# Patient Record
Sex: Male | Born: 1955 | Race: Black or African American | Hispanic: No | Marital: Single | Smoking: Never smoker
Health system: Southern US, Community
[De-identification: ages and names within clinical notes are randomized; demographics above are authoritative.]

## PROBLEM LIST (undated history)

## (undated) DIAGNOSIS — I10 Essential (primary) hypertension: Secondary | ICD-10-CM

---

## 2018-04-22 ENCOUNTER — Encounter (HOSPITAL_BASED_OUTPATIENT_CLINIC_OR_DEPARTMENT_OTHER): Payer: Self-pay | Admitting: Emergency Medicine

## 2018-04-22 ENCOUNTER — Inpatient Hospital Stay (HOSPITAL_BASED_OUTPATIENT_CLINIC_OR_DEPARTMENT_OTHER)
Admission: EM | Admit: 2018-04-22 | Discharge: 2018-04-24 | DRG: 066 | Disposition: A | Discharge status: Home / Self Care | Payer: Self-pay | Attending: Internal Medicine | Admitting: Internal Medicine

## 2018-04-22 ENCOUNTER — Emergency Department (HOSPITAL_BASED_OUTPATIENT_CLINIC_OR_DEPARTMENT_OTHER): Payer: Self-pay

## 2018-04-22 ENCOUNTER — Other Ambulatory Visit: Payer: Self-pay

## 2018-04-22 DIAGNOSIS — I6381 Other cerebral infarction due to occlusion or stenosis of small artery: Principal | ICD-10-CM | POA: Diagnosis present

## 2018-04-22 DIAGNOSIS — R030 Elevated blood-pressure reading, without diagnosis of hypertension: Secondary | ICD-10-CM

## 2018-04-22 DIAGNOSIS — I10 Essential (primary) hypertension: Secondary | ICD-10-CM | POA: Diagnosis present

## 2018-04-22 DIAGNOSIS — I639 Cerebral infarction, unspecified: Secondary | ICD-10-CM | POA: Diagnosis present

## 2018-04-22 DIAGNOSIS — G8311 Monoplegia of lower limb affecting right dominant side: Secondary | ICD-10-CM | POA: Diagnosis present

## 2018-04-22 DIAGNOSIS — T465X6A Underdosing of other antihypertensive drugs, initial encounter: Secondary | ICD-10-CM | POA: Diagnosis present

## 2018-04-22 DIAGNOSIS — E785 Hyperlipidemia, unspecified: Secondary | ICD-10-CM | POA: Diagnosis present

## 2018-04-22 DIAGNOSIS — R29898 Other symptoms and signs involving the musculoskeletal system: Secondary | ICD-10-CM

## 2018-04-22 DIAGNOSIS — I672 Cerebral atherosclerosis: Secondary | ICD-10-CM | POA: Diagnosis present

## 2018-04-22 DIAGNOSIS — R278 Other lack of coordination: Secondary | ICD-10-CM | POA: Diagnosis present

## 2018-04-22 DIAGNOSIS — Z823 Family history of stroke: Secondary | ICD-10-CM

## 2018-04-22 DIAGNOSIS — Z91128 Patient's intentional underdosing of medication regimen for other reason: Secondary | ICD-10-CM

## 2018-04-22 HISTORY — DX: Essential (primary) hypertension: I10

## 2018-04-22 LAB — URINALYSIS, MICROSCOPIC (REFLEX)

## 2018-04-22 LAB — URINALYSIS, ROUTINE W REFLEX MICROSCOPIC
Bilirubin Urine: NEGATIVE
Glucose, UA: NEGATIVE mg/dL
Hgb urine dipstick: NEGATIVE
Ketones, ur: NEGATIVE mg/dL
NITRITE: NEGATIVE
Protein, ur: NEGATIVE mg/dL
SPECIFIC GRAVITY, URINE: 1.02 (ref 1.005–1.030)
pH: 6.5 (ref 5.0–8.0)

## 2018-04-22 LAB — CBC
HCT: 40.2 % (ref 39.0–52.0)
HEMOGLOBIN: 12.2 g/dL — AB (ref 13.0–17.0)
MCH: 23.1 pg — ABNORMAL LOW (ref 26.0–34.0)
MCHC: 30.3 g/dL (ref 30.0–36.0)
MCV: 76.3 fL — ABNORMAL LOW (ref 80.0–100.0)
NRBC: 0 % (ref 0.0–0.2)
Platelets: 249 10*3/uL (ref 150–400)
RBC: 5.27 MIL/uL (ref 4.22–5.81)
RDW: 14.3 % (ref 11.5–15.5)
WBC: 5 10*3/uL (ref 4.0–10.5)

## 2018-04-22 LAB — PROTIME-INR
INR: 0.97
PROTHROMBIN TIME: 12.7 s (ref 11.4–15.2)

## 2018-04-22 LAB — BASIC METABOLIC PANEL
ANION GAP: 10 (ref 5–15)
BUN: 12 mg/dL (ref 8–23)
CO2: 26 mmol/L (ref 22–32)
Calcium: 8.8 mg/dL — ABNORMAL LOW (ref 8.9–10.3)
Chloride: 103 mmol/L (ref 98–111)
Creatinine, Ser: 1.07 mg/dL (ref 0.61–1.24)
GLUCOSE: 117 mg/dL — AB (ref 70–99)
POTASSIUM: 3.5 mmol/L (ref 3.5–5.1)
SODIUM: 139 mmol/L (ref 135–145)

## 2018-04-22 LAB — TROPONIN I

## 2018-04-22 LAB — APTT: APTT: 31 s (ref 24–36)

## 2018-04-22 NOTE — ED Triage Notes (Addendum)
Reports intermittent dizziness which began 5am today. C/o weakness to the right leg.

## 2018-04-22 NOTE — ED Provider Notes (Signed)
[facility] EMERGENCY DEPARTMENT Provider Note   CSN: [id number] Arrival date & time: 04/22/18  2049     History   Chief Complaint Chief Complaint  Patient presents with  . Dizziness    HPI Ryan Bauer is a 62 y.o. male.  HPI Patient with history of hypertension but has not been taking medication for some time.  States he went to bed around 1 AM this morning in his normal state of health.  Woke up at 5 AM with right leg numbness and weakness.  Describes the numbness as feeling like his leg was "asleep".  States that the symptoms have persisted throughout the day.  He denies any upper extremity weakness.  Denies visual or speech changes.  Denies chest pain or shortness of breath. Past Medical History:  Diagnosis Date  . Hypertension     There are no active problems to display for this patient.   History reviewed. No pertinent surgical history.      Home Medications    Prior to Admission medications   Not on File    Family History History reviewed. No pertinent family history.  Social History Social History   Tobacco Use  . Smoking status: Never Smoker  . Smokeless tobacco: Never Used  Substance Use Topics  . Alcohol use: Never    Frequency: Never  . Drug use: Never     Allergies   Patient has no known allergies.   Review of Systems Review of Systems  Constitutional: Negative for chills, fatigue and fever.  HENT: Negative for trouble swallowing.   Eyes: Negative for visual disturbance.  Respiratory: Negative for cough and shortness of breath.   Cardiovascular: Negative for chest pain and leg swelling.  Gastrointestinal: Negative for abdominal pain, diarrhea, nausea and vomiting.  Musculoskeletal: Negative for back pain, gait problem, myalgias and neck pain.  Skin: Negative for rash and wound.  Neurological: Positive for weakness and numbness. Negative for dizziness, light-headedness and headaches.  All other systems reviewed and are  negative.    Physical Exam Updated Vital Signs BP (!) 172/100   Pulse 68   Temp 98.3 F (36.8 C) (Oral)   Resp 19   Ht 5\' 8"  (1.727 m)   Wt 86.2 kg   SpO2 100%   BMI 28.89 kg/m   Physical Exam  Constitutional: He is oriented to person, place, and time. He appears well-developed and well-nourished. No distress.  HENT:  Head: Normocephalic and atraumatic.  Mouth/Throat: Oropharynx is clear and moist.  Cranial nerves II through XII intact  Eyes: Pupils are equal, round, and reactive to light. EOM are normal.  Neck: Normal range of motion. Neck supple.  Cardiovascular: Normal rate and regular rhythm.  Pulmonary/Chest: Effort normal and breath sounds normal.  Abdominal: Soft. Bowel sounds are normal. There is no tenderness. There is no rebound and no guarding.  Musculoskeletal: Normal range of motion. He exhibits no edema or tenderness.  Neurological: He is alert and oriented to person, place, and time.  Patient is alert and oriented x3 with clear, goal oriented speech. Patient has 5/5 motor in bilateral upper and left lower extremities.  Drift in right lower extremity.  Decreased sensation to light touch in the right lower extremity.  Sensation otherwise intact.  Bilateral finger-to-nose is normal with no signs of dysmetria. Patient has a normal gait and walks without assistance.  Skin: Skin is warm and dry. No rash noted. He is not diaphoretic. No erythema.  Psychiatric: He has a normal mood  and affect. His behavior is normal.  Nursing note and vitals reviewed.    ED Treatments / Results  Labs (all labs ordered are listed, but only abnormal results are displayed) Labs Reviewed  BASIC METABOLIC PANEL - Abnormal; Notable for the following components:      Result Value   Glucose, Bld 117 (*)    Calcium 8.8 (*)    All other components within normal limits  CBC - Abnormal; Notable for the following components:   Hemoglobin 12.2 (*)    MCV 76.3 (*)    MCH 23.1 (*)    All  other components within normal limits  URINALYSIS, ROUTINE W REFLEX MICROSCOPIC - Abnormal; Notable for the following components:   Leukocytes, UA TRACE (*)    All other components within normal limits  URINALYSIS, MICROSCOPIC (REFLEX) - Abnormal; Notable for the following components:   Bacteria, UA FEW (*)    All other components within normal limits  TROPONIN I  PROTIME-INR  APTT    EKG EKG Interpretation  Date/Time:  Friday April 22 2018 21:02:44 EDT Ventricular Rate:  67 PR Interval:    QRS Duration: 87 QT Interval:  376 QTC Calculation: 397 R Axis:   23 Text Interpretation:  Sinus rhythm Probable left atrial enlargement Borderline T wave abnormalities Confirmed by Loren Racer ([id number]) on 04/22/2018 10:47:12 PM   Radiology Ct Head Wo Contrast  Result Date: 04/22/2018 CLINICAL DATA:  62 y/o M; intermittent dizziness. Weakness in the right leg. EXAM: CT HEAD WITHOUT CONTRAST TECHNIQUE: Contiguous axial images were obtained from the base of the skull through the vertex without intravenous contrast. COMPARISON:  None. FINDINGS: Brain: No evidence of acute infarction, hemorrhage, hydrocephalus, extra-axial collection or mass lesion/mass effect. Vascular: No hyperdense vessel or unexpected calcification. Skull: Normal. Negative for fracture or focal lesion. Sinuses/Orbits: No acute finding. Other: None. IMPRESSION: No acute intracranial abnormality identified. Unremarkable CT of the head for age. Electronically Signed   By: Mitzi Hansen M.D.   On: 04/22/2018 21:45    Procedures Procedures (including critical care time)  Medications Ordered in ED Medications - No data to display   Initial Impression / Assessment and Plan / ED Course  I have reviewed the triage vital signs and the nursing notes.  Pertinent labs & imaging results that were available during my care of the patient were reviewed by me and considered in my medical decision making (see chart for  details).     CT head without acute findings.  Discussed with Dr. Otelia Limes who reviewed patient's CT.  Recommends MRI to rule out stroke.  If negative can follow-up as outpatient.  Discussed with Dr. Lockie Mola will accept patient in transfer to [facility] emergency department.  Final Clinical Impressions(s) / ED Diagnoses   Final diagnoses:  Right leg weakness  Elevated blood pressure reading    ED Discharge Orders    None       Loren Racer, MD 04/22/18 2247

## 2018-04-22 NOTE — ED Notes (Signed)
[facility] notified Benna Dunks) - patient to be transported to [facility] ED

## 2018-04-23 ENCOUNTER — Inpatient Hospital Stay (HOSPITAL_COMMUNITY): Payer: Self-pay

## 2018-04-23 ENCOUNTER — Encounter (HOSPITAL_COMMUNITY): Payer: Self-pay

## 2018-04-23 ENCOUNTER — Emergency Department (HOSPITAL_COMMUNITY): Payer: Self-pay

## 2018-04-23 ENCOUNTER — Encounter (HOSPITAL_COMMUNITY): Payer: Self-pay | Admitting: Internal Medicine

## 2018-04-23 DIAGNOSIS — I639 Cerebral infarction, unspecified: Secondary | ICD-10-CM

## 2018-04-23 DIAGNOSIS — I10 Essential (primary) hypertension: Secondary | ICD-10-CM | POA: Diagnosis present

## 2018-04-23 DIAGNOSIS — R29898 Other symptoms and signs involving the musculoskeletal system: Secondary | ICD-10-CM

## 2018-04-23 DIAGNOSIS — R030 Elevated blood-pressure reading, without diagnosis of hypertension: Secondary | ICD-10-CM

## 2018-04-23 LAB — ECHOCARDIOGRAM COMPLETE
Height: 68 in
WEIGHTICAEL: 3040 [oz_av]

## 2018-04-23 LAB — LIPID PANEL
Cholesterol: 194 mg/dL (ref 0–200)
HDL: 43 mg/dL (ref >40)
LDL CALC: 133 mg/dL — AB (ref 0–99)
TRIGLYCERIDES: 88 mg/dL (ref <150)
Total CHOL/HDL Ratio: 4.5 RATIO
VLDL: 18 mg/dL (ref 0–40)

## 2018-04-23 LAB — HIV ANTIBODY (ROUTINE TESTING W REFLEX): HIV SCREEN 4TH GENERATION: NONREACTIVE

## 2018-04-23 LAB — HEMOGLOBIN A1C
Hgb A1c MFr Bld: 6.6 % — ABNORMAL HIGH (ref 4.8–5.6)
MEAN PLASMA GLUCOSE: 142.72 mg/dL

## 2018-04-23 MED ORDER — ENOXAPARIN SODIUM 40 MG/0.4ML ~~LOC~~ SOLN
40.0000 mg | SUBCUTANEOUS | Status: DC
  Administered 2018-04-23 – 2018-04-24 (×2): 40 mg via SUBCUTANEOUS
  Filled 2018-04-23 (×2): qty 0.4

## 2018-04-23 MED ORDER — CLOPIDOGREL BISULFATE 75 MG PO TABS
75.0000 mg | ORAL_TABLET | Freq: Every day | ORAL | Status: DC
  Administered 2018-04-23 – 2018-04-24 (×2): 75 mg via ORAL
  Filled 2018-04-23 (×2): qty 1

## 2018-04-23 MED ORDER — ATORVASTATIN CALCIUM 40 MG PO TABS
40.0000 mg | ORAL_TABLET | Freq: Every day | ORAL | Status: DC
  Administered 2018-04-23: 40 mg via ORAL
  Filled 2018-04-23: qty 1

## 2018-04-23 MED ORDER — ASPIRIN 300 MG RE SUPP: 300.0000 mg | Freq: Every day | RECTAL | Status: DC

## 2018-04-23 MED ORDER — ASPIRIN 325 MG PO TABS: 325.0000 mg | ORAL_TABLET | Freq: Every day | ORAL | Status: DC

## 2018-04-23 MED ORDER — ACETAMINOPHEN 325 MG PO TABS: 650.0000 mg | ORAL_TABLET | ORAL | Status: DC | PRN

## 2018-04-23 MED ORDER — ACETAMINOPHEN 160 MG/5ML PO SOLN: 650.0000 mg | ORAL | Status: DC | PRN

## 2018-04-23 MED ORDER — ASPIRIN EC 81 MG PO TBEC
81.0000 mg | DELAYED_RELEASE_TABLET | Freq: Every day | ORAL | Status: DC
  Administered 2018-04-23 – 2018-04-24 (×2): 81 mg via ORAL
  Filled 2018-04-23 (×2): qty 1

## 2018-04-23 MED ORDER — ACETAMINOPHEN 650 MG RE SUPP: 650.0000 mg | RECTAL | Status: DC | PRN

## 2018-04-23 MED ORDER — STROKE: EARLY STAGES OF RECOVERY BOOK
Freq: Once | Status: AC
  Administered 2018-04-23: 05:00:00
  Filled 2018-04-23 (×2): qty 1

## 2018-04-23 NOTE — Progress Notes (Signed)
History and Physical    Ryan Bauer WUJ:[id number] DOB: [birth year] DOA: 04/22/2018   seen evaluated on rounds today.  H and P and consulting physicians notes reviewed noted  Past Medical History:  Diagnosis Date  . Hypertension     History reviewed. No pertinent surgical history.   reports that he has never smoked. He has never used smokeless tobacco. He reports that he does not drink alcohol or use drugs.  No Known Allergies  Family History  Problem Relation Age of Onset  . Stroke Mother   . Stroke Brother      Prior to Admission medications   Not on File    Physical Exam: Vitals:   04/23/18 0015 04/23/18 0030 04/23/18 0100 04/23/18 0524  BP: (!) 141/98 (!) 182/100 (!) 184/108 (!) 173/108  Pulse: 64 66 70 64  Resp: 19 16 20 17   Temp:    98.1 F (36.7 C)  TempSrc:    Oral  SpO2: 99% 99% 99% 99%  Weight:      Height:          Constitutional: NAD, calm, comfortable Vitals:   04/23/18 0015 04/23/18 0030 04/23/18 0100 04/23/18 0524  BP: (!) 141/98 (!) 182/100 (!) 184/108 (!) 173/108  Pulse: 64 66 70 64  Resp: 19 16 20 17   Temp:    98.1 F (36.7 C)  TempSrc:    Oral  SpO2: 99% 99% 99% 99%  Weight:      Height:       Labs on Admission: I have personally reviewed following labs and imaging studies  CBC: Recent Labs  Lab 04/22/18 2107  WBC 5.0  HGB 12.2*  HCT 40.2  MCV 76.3*  PLT 249   Basic Metabolic Panel: Recent Labs  Lab 04/22/18 2107  NA 139  K 3.5  CL 103  CO2 26  GLUCOSE 117*  BUN 12  CREATININE 1.07  CALCIUM 8.8*   GFR: Estimated Creatinine Clearance: 77.4 mL/min (by C-G formula based on SCr of 1.07 mg/dL). Liver Function Tests: No results for input(s): AST, ALT, ALKPHOS, BILITOT, PROT, ALBUMIN in the last 168 hours. No results for input(s): LIPASE, AMYLASE in the last 168 hours. No results for input(s): AMMONIA in the last 168 hours. Coagulation Profile: Recent Labs  Lab 04/22/18 2120  INR 0.97   Cardiac  Enzymes: Recent Labs  Lab 04/22/18 2120  TROPONINI <0.03   BNP (last 3 results) No results for input(s): PROBNP in the last 8760 hours. HbA1C: Recent Labs    04/23/18 0402  HGBA1C 6.6*   CBG: No results for input(s): GLUCAP in the last 168 hours. Lipid Profile: Recent Labs    04/23/18 0402  CHOL 194  HDL 43  LDLCALC 133*  TRIG 88  CHOLHDL 4.5   Thyroid Function Tests: No results for input(s): TSH, T4TOTAL, FREET4, T3FREE, THYROIDAB in the last 72 hours. Anemia Panel: No results for input(s): VITAMINB12, FOLATE, FERRITIN, TIBC, IRON, RETICCTPCT in the last 72 hours. Urine analysis:    Component Value Date/Time   COLORURINE YELLOW 04/22/2018 2201   APPEARANCEUR CLEAR 04/22/2018 2201   LABSPEC 1.020 04/22/2018 2201   PHURINE 6.5 04/22/2018 2201   GLUCOSEU NEGATIVE 04/22/2018 2201   HGBUR NEGATIVE 04/22/2018 2201   BILIRUBINUR NEGATIVE 04/22/2018 2201   KETONESUR NEGATIVE 04/22/2018 2201   PROTEINUR NEGATIVE 04/22/2018 2201   NITRITE NEGATIVE 04/22/2018 2201   LEUKOCYTESUR TRACE (A) 04/22/2018 2201   Sepsis Labs: @LABRCNTIP (procalcitonin:4,lacticidven:4) )No results found for this or any previous visit (from  the past 240 hour(s)).   Radiological Exams on Admission: Dg Chest 2 View  Result Date: 04/23/2018 CLINICAL DATA:  62 year old male with intermittent dizziness. EXAM: CHEST - 2 VIEW COMPARISON:  None. FINDINGS: The heart size and mediastinal contours are within normal limits. Both lungs are clear. The visualized skeletal structures are unremarkable. IMPRESSION: No active cardiopulmonary disease. Electronically Signed   By: Elgie Collard M.D.   On: 04/23/2018 03:56   Ct Head Wo Contrast  Result Date: 04/22/2018 CLINICAL DATA:  62 y/o M; intermittent dizziness. Weakness in the right leg. EXAM: CT HEAD WITHOUT CONTRAST TECHNIQUE: Contiguous axial images were obtained from the base of the skull through the vertex without intravenous contrast. COMPARISON:  None.  FINDINGS: Brain: No evidence of acute infarction, hemorrhage, hydrocephalus, extra-axial collection or mass lesion/mass effect. Vascular: No hyperdense vessel or unexpected calcification. Skull: Normal. Negative for fracture or focal lesion. Sinuses/Orbits: No acute finding. Other: None. IMPRESSION: No acute intracranial abnormality identified. Unremarkable CT of the head for age. Electronically Signed   By: Mitzi Hansen M.D.   On: 04/22/2018 21:45   Mr Brain Wo Contrast  Result Date: 04/23/2018 CLINICAL DATA:  RIGHT leg numbness and weakness. History of hypertension, off medication. EXAM: MRI HEAD WITHOUT CONTRAST TECHNIQUE: Multiplanar, multiecho pulse sequences of the brain and surrounding structures were obtained without intravenous contrast. COMPARISON:  CT HEAD April 22, 2018 FINDINGS: INTRACRANIAL CONTENTS: 6 mm ovoid reduced diffusion LEFT thalamus/posterior limb of the internal capsule. No susceptibility artifact to suggest hemorrhage. The ventricles and sulci are normal for patient's age. No suspicious parenchymal signal, masses, mass effect. Scattered subcentimeter supratentorial white matter FLAIR T2 hyperintensities. No abnormal extra-axial fluid collections. No extra-axial masses. VASCULAR: Normal major intracranial vascular flow voids present at skull base. SKULL AND UPPER CERVICAL SPINE: No abnormal sellar expansion. No suspicious calvarial bone marrow signal. Craniocervical junction maintained. SINUSES/ORBITS: Trace paranasal sinus mucosal thickening. Small maxillary mucosal retention cysts. Mastoid air cells are well aerated. Included ocular globes and orbital contents are non-suspicious. OTHER: None. IMPRESSION: 1. 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 2. Mild chronic small vessel ischemic changes. Electronically Signed   By: Awilda Metro M.D.   On: 04/23/2018 01:52   Mr Maxine Glenn Head Wo Contrast  Result Date: 04/23/2018 CLINICAL DATA:  Follow up  stroke. History of hypertension, off medication. EXAM: MRA HEAD WITHOUT CONTRAST TECHNIQUE: Angiographic images of the Circle of Willis were obtained using MRA technique without intravenous contrast. COMPARISON:  MRI head April 23, 2018 FINDINGS: ANTERIOR CIRCULATION: Normal flow related enhancement of the included cervical, petrous, cavernous and supraclinoid internal carotid arteries. Patent anterior communicating artery. Patent anterior and middle cerebral arteries, mild luminal irregularity compatible with atherosclerosis. No large vessel occlusion, flow limiting stenosis, aneurysm. POSTERIOR CIRCULATION: LEFT vertebral artery is dominant. Vertebrobasilar arteries are patent, with normal flow related enhancement of the main branch vessels. Patent posterior cerebral arteries, mild luminal irregularity compatible with atherosclerosis. No large vessel occlusion, flow limiting stenosis,  aneurysm. ANATOMIC VARIANTS: None. Source images and MIP images were reviewed. IMPRESSION: 1. No emergent large vessel occlusion. 2. Mild intracranial atherosclerosis. Electronically Signed   By: Awilda Metro M.D.   On: 04/23/2018 05:24     Jackie Plum MD [facility] Pager [phone]  If 7PM-7AM, please contact night-coverage [email or web address] Password [facility]  04/23/2018, 10:54 AM

## 2018-04-23 NOTE — Progress Notes (Signed)
Admitted into 3W31 for stroke work up. A/O x4 on arrival. Neuro intact. Ambulatory. Placed on cardiac monitor and safety measures put in place. Oriented to room . Family at bedside.

## 2018-04-23 NOTE — Progress Notes (Signed)
  Echocardiogram 2D Echocardiogram has been performed.  Ryan Bauer 04/23/2018, 3:36 PM

## 2018-04-23 NOTE — Evaluation (Signed)
Occupational Therapy Evaluation Patient Details Name: Ryan Bauer MRN: [id number] DOB: 31-Dec-1955 Today's Date: 04/23/2018    History of Present Illness 62 yo male admitted with MRI brain shows acute ischemic stroke in L thalamus / internal capsule. PMH HTN   Clinical Impression   Patient evaluated by Occupational Therapy with no further acute OT needs identified. All education has been completed for signs/ symptoms of stroke/ be fast and the patient has no further questions. See below for any follow-up Occupational Therapy or equipment needs. OT to sign off. Thank you for referral.      Follow Up Recommendations  No OT follow up    Equipment Recommendations  None recommended by OT    Recommendations for Other Services       Precautions / Restrictions Precautions Precautions: Fall      Mobility Bed Mobility Overal bed mobility: Independent                Transfers Overall transfer level: Independent                    Balance                                           ADL either performed or assessed with clinical judgement   ADL Overall ADL's : Modified independent                                       General ADL Comments: pt does demonstrate favor to the right side with transfers     Vision Baseline Vision/History: Wears glasses Wears Glasses: Reading only Additional Comments: able to read/ able to complete visual handout without error     Perception     Praxis      Pertinent Vitals/Pain Pain Assessment: No/denies pain     Hand Dominance Right   Extremity/Trunk Assessment Upper Extremity Assessment Upper Extremity Assessment: Overall WFL for tasks assessed       Cervical / Trunk Assessment Cervical / Trunk Assessment: Normal   Communication Communication Communication: No difficulties   Cognition Arousal/Alertness: Awake/alert Behavior During Therapy: WFL for tasks  assessed/performed Overall Cognitive Status: Within Functional Limits for tasks assessed                                     General Comments       Exercises     Shoulder Instructions      Home Living Family/patient expects to be discharged to:: Private residence Living Arrangements: Alone Available Help at Discharge: Family;Friend(s);Available 24 hours/day Type of Home: House Home Access: Level entry     Home Layout: Able to live on main level with bedroom/bathroom     Bathroom Shower/Tub: Producer, television/film/video: Standard     Home Equipment: None          Prior Functioning/Environment Level of Independence: Independent        Comments: works Copywriter, advertising business doing Futures trader Problem List:        OT Treatment/Interventions:      OT Goals(Current goals can be found in the care plan section) Acute Rehab OT Goals Patient Stated Goal:  to return to working and leave today  OT Frequency:     Barriers to D/C:            Co-evaluation              AM-PAC PT "6 Clicks" Daily Activity     Outcome Measure Help from another person eating meals?: None Help from another person taking care of personal grooming?: None Help from another person toileting, which includes using toliet, bedpan, or urinal?: None Help from another person bathing (including washing, rinsing, drying)?: None Help from another person to put on and taking off regular upper body clothing?: None Help from another person to put on and taking off regular lower body clothing?: None 6 Click Score: 24   End of Session Equipment Utilized During Treatment: Gait belt Nurse Communication: Mobility status;Precautions  Activity Tolerance: Patient tolerated treatment well Patient left: in bed;with call bell/phone within reach;with family/visitor present  OT Visit Diagnosis: Unsteadiness on feet (R26.81)                Time: 1610-9604 OT Time Calculation  (min): 16 min Charges:  OT General Charges $OT Visit: 1 Visit OT Evaluation $OT Eval Low Complexity: 1 Low   Mateo Flow, OTR/L  Acute Rehabilitation Services Pager: [phone] Office: [phone] .   Boone Master B 04/23/2018, 3:26 PM

## 2018-04-23 NOTE — H&P (Signed)
History and Physical    Ryan Bauer ZOX:[id number] DOB: 1956-06-10 DOA: 04/22/2018  PCP: Joaquin Courts, DO  Patient coming from: Home  I have personally briefly reviewed patient's old medical records in [facility]  Chief Complaint: Loss of balance  HPI: Ryan Bauer is a 62 y.o. male with medical history significant of HTN.  Patient presents to the ED with c/o "loss of balance" in his R leg.  Symptoms onset when he got up in the AM of 11/1 around 5am.  Symptoms persistent throughout the day today.  Pins and needles sensation in R leg.  Presents to ED.   ED Course: MRI brain shows acute ischemic stroke in L thalamus / internal capsule.   Review of Systems: As per HPI otherwise 10 point review of systems negative.   Past Medical History:  Diagnosis Date  . Hypertension     History reviewed. No pertinent surgical history.   reports that he has never smoked. He has never used smokeless tobacco. He reports that he does not drink alcohol or use drugs.  No Known Allergies  Family History  Problem Relation Age of Onset  . Stroke Mother   . Stroke Brother      Prior to Admission medications   Not on File    Physical Exam: Vitals:   04/23/18 0000 04/23/18 0015 04/23/18 0030 04/23/18 0100  BP: (!) 171/92 (!) 141/98 (!) 182/100 (!) 184/108  Pulse: 65 64 66 70  Resp: 13 19 16 20   Temp:      TempSrc:      SpO2: 100% 99% 99% 99%  Weight:      Height:        Constitutional: NAD, calm, comfortable Eyes: PERRL, lids and conjunctivae normal ENMT: Mucous membranes are moist. Posterior pharynx clear of any exudate or lesions.Normal dentition.  Neck: normal, supple, no masses, no thyromegaly Respiratory: clear to auscultation bilaterally, no wheezing, no crackles. Normal respiratory effort. No accessory muscle use.  Cardiovascular: Regular rate and rhythm, no murmurs / rubs / gallops. No extremity edema. 2+ pedal pulses. No carotid bruits.  Abdomen: no  tenderness, no masses palpated. No hepatosplenomegaly. Bowel sounds positive.  Musculoskeletal: no clubbing / cyanosis. No joint deformity upper and lower extremities. Good ROM, no contractures. Normal muscle tone.  Skin: no rashes, lesions, ulcers. No induration Neurologic: Decreased sensation to light touch in the right lower extremity. Psychiatric: Normal judgment and insight. Alert and oriented x 3. Normal mood.    Labs on Admission: I have personally reviewed following labs and imaging studies  CBC: Recent Labs  Lab 04/22/18 2107  WBC 5.0  HGB 12.2*  HCT 40.2  MCV 76.3*  PLT 249   Basic Metabolic Panel: Recent Labs  Lab 04/22/18 2107  NA 139  K 3.5  CL 103  CO2 26  GLUCOSE 117*  BUN 12  CREATININE 1.07  CALCIUM 8.8*   GFR: Estimated Creatinine Clearance: 77.4 mL/min (by C-G formula based on SCr of 1.07 mg/dL). Liver Function Tests: No results for input(s): AST, ALT, ALKPHOS, BILITOT, PROT, ALBUMIN in the last 168 hours. No results for input(s): LIPASE, AMYLASE in the last 168 hours. No results for input(s): AMMONIA in the last 168 hours. Coagulation Profile: Recent Labs  Lab 04/22/18 2120  INR 0.97   Cardiac Enzymes: Recent Labs  Lab 04/22/18 2120  TROPONINI <0.03   BNP (last 3 results) No results for input(s): PROBNP in the last 8760 hours. HbA1C: No results for input(s): HGBA1C in  the last 72 hours. CBG: No results for input(s): GLUCAP in the last 168 hours. Lipid Profile: No results for input(s): CHOL, HDL, LDLCALC, TRIG, CHOLHDL, LDLDIRECT in the last 72 hours. Thyroid Function Tests: No results for input(s): TSH, T4TOTAL, FREET4, T3FREE, THYROIDAB in the last 72 hours. Anemia Panel: No results for input(s): VITAMINB12, FOLATE, FERRITIN, TIBC, IRON, RETICCTPCT in the last 72 hours. Urine analysis:    Component Value Date/Time   COLORURINE YELLOW 04/22/2018 2201   APPEARANCEUR CLEAR 04/22/2018 2201   LABSPEC 1.020 04/22/2018 2201   PHURINE  6.5 04/22/2018 2201   GLUCOSEU NEGATIVE 04/22/2018 2201   HGBUR NEGATIVE 04/22/2018 2201   BILIRUBINUR NEGATIVE 04/22/2018 2201   KETONESUR NEGATIVE 04/22/2018 2201   PROTEINUR NEGATIVE 04/22/2018 2201   NITRITE NEGATIVE 04/22/2018 2201   LEUKOCYTESUR TRACE (A) 04/22/2018 2201    Radiological Exams on Admission: Ct Head Wo Contrast  Result Date: 04/22/2018 CLINICAL DATA:  62 y/o M; intermittent dizziness. Weakness in the right leg. EXAM: CT HEAD WITHOUT CONTRAST TECHNIQUE: Contiguous axial images were obtained from the base of the skull through the vertex without intravenous contrast. COMPARISON:  None. FINDINGS: Brain: No evidence of acute infarction, hemorrhage, hydrocephalus, extra-axial collection or mass lesion/mass effect. Vascular: No hyperdense vessel or unexpected calcification. Skull: Normal. Negative for fracture or focal lesion. Sinuses/Orbits: No acute finding. Other: None. IMPRESSION: No acute intracranial abnormality identified. Unremarkable CT of the head for age. Electronically Signed   By: Mitzi Hansen M.D.   On: 04/22/2018 21:45   Mr Brain Wo Contrast  Result Date: 04/23/2018 CLINICAL DATA:  RIGHT leg numbness and weakness. History of hypertension, off medication. EXAM: MRI HEAD WITHOUT CONTRAST TECHNIQUE: Multiplanar, multiecho pulse sequences of the brain and surrounding structures were obtained without intravenous contrast. COMPARISON:  CT HEAD April 22, 2018 FINDINGS: INTRACRANIAL CONTENTS: 6 mm ovoid reduced diffusion LEFT thalamus/posterior limb of the internal capsule. No susceptibility artifact to suggest hemorrhage. The ventricles and sulci are normal for patient's age. No suspicious parenchymal signal, masses, mass effect. Scattered subcentimeter supratentorial white matter FLAIR T2 hyperintensities. No abnormal extra-axial fluid collections. No extra-axial masses. VASCULAR: Normal major intracranial vascular flow voids present at skull base. SKULL AND  UPPER CERVICAL SPINE: No abnormal sellar expansion. No suspicious calvarial bone marrow signal. Craniocervical junction maintained. SINUSES/ORBITS: Trace paranasal sinus mucosal thickening. Small maxillary mucosal retention cysts. Mastoid air cells are well aerated. Included ocular globes and orbital contents are non-suspicious. OTHER: None. IMPRESSION: 1. 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 2. Mild chronic small vessel ischemic changes. Electronically Signed   By: Awilda Metro M.D.   On: 04/23/2018 01:52    EKG: Independently reviewed.  Assessment/Plan Principal Problem:   Acute ischemic stroke (HCC) Active Problems:   HTN (hypertension)    1. Acute ischemic stroke - 1. Neuro consult 2. Stroke pathway 3. MRA 4. Tele monitor 5. 2d echo 6. Carotid dopplers 7. FLP, A1C 8. PT/OT/SLP 9. ASA 325 2. HTN - 1. Holding BP meds and allowing permissive HTN 2. Patient actually hasnt been on any BP meds for quite some time it seems.  DVT prophylaxis: Lovenox Code Status: Full Family Communication: Family at bedside Disposition Plan: Home after admit Consults called: Neuro Admission status: Admit to inpatient  Severity of Illness: The appropriate patient status for this patient is INPATIENT. Inpatient status is judged to be reasonable and necessary in order to provide the required intensity of service to ensure the patient's safety. The patient's presenting symptoms, physical exam findings, and  initial radiographic and laboratory data in the context of their chronic comorbidities is felt to place them at high risk for further clinical deterioration. Furthermore, it is not anticipated that the patient will be medically stable for discharge from the hospital within 2 midnights of admission. The following factors support the patient status of inpatient.   " The patient's presenting symptoms include RLE loss of sensation, focal neurologic deficit. " The  worrisome physical exam findings include loss of sensation RLE . " The initial radiographic and laboratory data are worrisome because of Confirmed acute ischemic stroke on MRI. " The chronic co-morbidities include HTN.   * I certify that at the point of admission it is my clinical judgment that the patient will require inpatient hospital care spanning beyond 2 midnights from the point of admission due to high intensity of service, high risk for further deterioration and high frequency of surveillance required.Hillary Bow DO [facility] Pager [phone] Only works nights!  If 7AM-7PM, please contact the primary day team physician taking care of patient  [email or web address] Password [email or web address]  04/23/2018, 3:04 AM

## 2018-04-23 NOTE — ED Provider Notes (Signed)
12:35 AM  Patient transferred for MRI brain to evaluate for possible stroke.  Presented to emergency department with right leg weakness and numbness.  Currently no focal neurologic deficits on exam.  Denies headache.  No chest pain or shortness of breath.  2:20 AM MRI shows acute small left thalamus/internal capsule nonhemorrhagic infarct.  D/w Dr. Otelia Limes with neurology who will see patient in consult.  2:50 AM Discussed patient's case with hospitalist, Dr. Julian Reil.  I have recommended admission and patient (and family if present) agree with this plan. Admitting physician will place admission orders.   I reviewed all nursing notes, vitals, pertinent previous records, EKGs, lab and urine results, imaging (as available).     Ryan Bauer, Layla Maw, DO 04/23/18 [id number]

## 2018-04-23 NOTE — ED Notes (Signed)
Patient transported to MRI 

## 2018-04-23 NOTE — Progress Notes (Signed)
STROKE TEAM PROGRESS NOTE   SUBJECTIVE (INTERVAL HISTORY) His brother is at the bedside.  Pt symptoms resolved and back to baseline. PT/OT no recs. Had TTE and CUS unremarkable. Denies smoking or alcohol or drugs but admitted that he did not check BP at home nor take any BP meds.    OBJECTIVE Vitals:   04/23/18 2005 04/24/18 0023 04/24/18 0358 04/24/18 0722  BP:   (!) 142/77 (!) 147/95  Pulse: 72   72  Resp: (!) 9   15  Temp:  98.4 F (36.9 C) 98.2 F (36.8 C)   TempSrc:  Oral Oral   SpO2: 98% 97%  100%  Weight:      Height:        CBC:  Recent Labs  Lab 04/22/18 2107  WBC 5.0  HGB 12.2*  HCT 40.2  MCV 76.3*  PLT 249    Basic Metabolic Panel:  Recent Labs  Lab 04/22/18 2107  NA 139  K 3.5  CL 103  CO2 26  GLUCOSE 117*  BUN 12  CREATININE 1.07  CALCIUM 8.8*    Lipid Panel:     Component Value Date/Time   CHOL 194 04/23/2018 0402   TRIG 88 04/23/2018 0402   HDL 43 04/23/2018 0402   CHOLHDL 4.5 04/23/2018 0402   VLDL 18 04/23/2018 0402   LDLCALC 133 (H) 04/23/2018 0402   HgbA1c:  Lab Results  Component Value Date   HGBA1C 6.6 (H) 04/23/2018   Urine Drug Screen:     Component Value Date/Time   LABOPIA NONE DETECTED 04/23/2018 0505   COCAINSCRNUR NONE DETECTED 04/23/2018 0505   LABBENZ NONE DETECTED 04/23/2018 0505   AMPHETMU NONE DETECTED 04/23/2018 0505   THCU NONE DETECTED 04/23/2018 0505   LABBARB NONE DETECTED 04/23/2018 0505    Alcohol Level No results found for: ETH  IMAGING   Ct Head Wo Contrast 04/22/2018 IMPRESSION:  No acute intracranial abnormality identified. Unremarkable CT of the head for age.    Mr Brain Wo Contrast 04/23/2018 IMPRESSION:  1. 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  2. Mild chronic small vessel ischemic changes.    [name] Head Wo Contrast 04/23/2018 IMPRESSION:  1. No emergent large vessel occlusion.  2. Mild intracranial atherosclerosis.    Dg Chest 2  View 04/23/2018 IMPRESSION:  No active cardiopulmonary disease.    Transthoracic Echocardiogram 04/23/2018 Study Conclusions - Left ventricle: The cavity size was normal. Wall thickness was   normal. Systolic function was normal. The estimated ejection   fraction was in the range of 60% to 65%. Wall motion was normal;   there were no regional wall motion abnormalities. Doppler   parameters are consistent with abnormal left ventricular   relaxation (grade 1 diastolic dysfunction). - Aortic valve: Valve area (VTI): 2.86 cm^2. Valve area (Vmax):   2.95 cm^2. Valve area (Vmean): 2.76 cm^2. - Right ventricle: The cavity size was mildly dilated.    Bilateral Carotid Dopplers  1-39% ICA plaquing. Vertebral artery flow is antegrade.    PHYSICAL EXAM  Temp:  [98 F (36.7 C)-98.7 F (37.1 C)] 98.2 F (36.8 C) (11/03 0358) Pulse Rate:  [65-88] 72 (11/03 0722) Resp:  [8-22] 15 (11/03 0722) BP: (142-157)/(77-105) 147/95 (11/03 0722) SpO2:  [97 %-100 %] 100 % (11/03 0722)  General - Well nourished, well developed, in no apparent distress.  Ophthalmologic - fundi not visualized due to noncooperation.  Cardiovascular - Regular rate and rhythm.  Mental Status -  Level of arousal and  orientation to time, place, and person were intact. Language including expression, naming, repetition, comprehension was assessed and found intact. Fund of Knowledge was assessed and was intact.  Cranial Nerves II - XII - II - Visual field intact OU. III, IV, VI - Extraocular movements intact. V - Facial sensation intact bilaterally. VII - Facial movement intact bilaterally. VIII - Hearing & vestibular intact bilaterally. X - Palate elevates symmetrically. XI - Chin turning & shoulder shrug intact bilaterally. XII - Tongue protrusion intact.  Motor Strength - The patient's strength was normal in all extremities and pronator drift was absent.  Bulk was normal and fasciculations were absent.   Motor  Tone - Muscle tone was assessed at the neck and appendages and was normal.  Reflexes - The patient's reflexes were symmetrical in all extremities and he had no pathological reflexes.  Sensory - Light touch, temperature/pinprick were assessed and were symmetrical.    Coordination - The patient had normal movements in the hands and feet with no ataxia or dysmetria.  Tremor was absent.  Gait and Station - deferred.    ASSESSMENT/PLAN [name] is a 62 y.o. male with history of hypertension (had not been taking medication) presenting with elevated BP, dizziness and right LE weakness with numbness. He did not receive IV t-PA due to late presentation.  Stroke: small LEFT thalamus/internal capsule - small vessel disease.  Resultant  Back to baseline  CT head   - no acute findings.  MRI head - Acute small LEFT thalamus/internal capsule nonhemorrhagic infarct.   MRA head - Mild intracranial atherosclerosis.   Carotid Doppler unremarkable   2D Echo  - EF 60 - 65%. No cardiac source of emboli identified.   UDS - negative  LDL - 133  HIV neg  HgbA1c - 6.6  VTE prophylaxis - Lovenox  Diet  - Heart healthy with thin liquids.  No antithrombotic prior to admission, now on aspirin 81 mg daily and Plavix 75 mg daily. Continue DAPT for 3 months and then ASA alone  Patient counseled to be compliant with his antithrombotic medications  Ongoing aggressive stroke risk factor management  Therapy recommendations:  No f/u therapies recommended  Disposition:  Pending  Hypertension  Elevated  . Put on amlodipine 10 . Educated on BP monitoring at home . Close PCP follow up  . Long-term BP goal normotensive  Hyperlipidemia  Lipid lowering medication PTA:  none  LDL 133, goal < 70  Current lipid lowering medication: Lipitor 40 mg daily  Continue statin at discharge  Other Stroke Risk Factors  Advanced age  Family hx stroke (mother and brother)  Medical non  compliance  Other Active Problems  Medical non compliance  Hospital day # 1  Neurology will sign off. Please call with questions. Pt will follow up with stroke clinic NP at [facility] in about 4 weeks. Thanks for the consult.  Marvel Plan, MD PhD Stroke Neurology 04/24/2018 11:40 AM    To contact Stroke Continuity provider, please refer to [email or web address]. After hours, contact General Neurology

## 2018-04-23 NOTE — Progress Notes (Signed)
Received bedside report from first shift RN and then spoke with pt. Pt refused having the bed alarm on. 

## 2018-04-23 NOTE — Progress Notes (Signed)
SLP Cancellation Note  Patient Details Name: Kia Stavros MRN: [id number] DOB: 1956/04/20   Cancelled treatment:       Reason Eval/Treat Not Completed: SLP screened, no needs identified, will sign off   Chales Abrahams 04/23/2018, 11:38 AM  Donavan Burnet, MS [facility] SLP Acute Rehab Services Pager [phone] Office [phone]

## 2018-04-23 NOTE — Consult Note (Signed)
Referring Physician: Dr. Leonides Schanz    Chief Complaint: RLE weakness  HPI: Ryan Bauer is an 62 y.o. male with HTN who presented to the [facility] ED on Friday evening with a c/c of RLE weakness and sensory numbness in conjunction with dizziness which began at 5 AM. The leg felt as though it had fallen asleep. His symptoms persisted, so he decided to be evaluated in the ED. He had no complaints of upper extremity weakness, vision changes or speech abnormality. He has not been taking antihypertensive medication for some time.   CT head was negative. Following CT, an MRI was obtained, which revealed an acute left lateral thalamic lacunar infarction.   EKG: NSR  Past Medical History:  Diagnosis Date  . Hypertension     History reviewed. No pertinent surgical history.  History reviewed. No pertinent family history. Social History:  reports that he has never smoked. He has never used smokeless tobacco. He reports that he does not drink alcohol or use drugs.  Allergies: No Known Allergies  Home Medications:  The patient currently does not take any medications.   ROS: No CP or SOB. Other ROS as per HPI.    Physical Examination: Blood pressure (!) 182/100, pulse 66, temperature 98 F (36.7 C), temperature source Oral, resp. rate 16, height '5\' 8"'$  (1.727 m), weight 86.2 kg, SpO2 99 %.  HEENT: [place]/AT Lungs: Respirations unlabored Ext: No edema   Neurologic Examination: Mental Status: Alert, fully oriented, thought content appropriate.  Speech fluent without evidence of aphasia.  Able to follow all commands without difficulty. Cranial Nerves: II:  Visual fields intact with no extinction to DSS. PERRL.  III,IV, VI: EOMI without nystagmus. No ptosis.  V,VII: Temp sensation equal bilaterally. No facial droop.  VIII: hearing intact to voice IX,X: No hypophonia XI: Symmetric XII: midline tongue extension  Motor: LUE and LLE: 5/5 RUE: 5/5 RLE: Subtle 4+/5 hip flexion and knee extension strength  deficit Sensory: Temp and light touch intact x 4.  Deep Tendon Reflexes:  1+ bilateral upper and lower extremities.  Plantars: Right: downgoing   Left: downgoing Cerebellar: No ataxia with FNF on the left. Mild dysmetria with RUE.  Gait: Deferred  Results for orders placed or performed during the hospital encounter of 04/22/18 (from the past 48 hour(s))  Basic metabolic panel     Status: Abnormal   Collection Time: 04/22/18  9:07 PM  Result Value Ref Range   Sodium 139 135 - 145 mmol/L   Potassium 3.5 3.5 - 5.1 mmol/L   Chloride 103 98 - 111 mmol/L   CO2 26 22 - 32 mmol/L   Glucose, Bld 117 (H) 70 - 99 mg/dL   BUN 12 8 - 23 mg/dL   Creatinine, Ser 1.07 0.61 - 1.24 mg/dL   Calcium 8.8 (L) 8.9 - 10.3 mg/dL   GFR calc non Af Amer >60 >60 mL/min   GFR calc Af Amer >60 >60 mL/min    Comment: (NOTE) The eGFR has been calculated using the CKD EPI equation. This calculation has not been validated in all clinical situations. eGFR's persistently <60 mL/min signify possible Chronic Kidney Disease.    Anion gap 10 5 - 15    Comment: Performed at [facility], [address] [place], [place] [address]  CBC     Status: Abnormal   Collection Time: 04/22/18  9:07 PM  Result Value Ref Range   WBC 5.0 4.0 - 10.5 K/uL   RBC 5.27 4.22 - 5.81  MIL/uL   Hemoglobin 12.2 (L) 13.0 - 17.0 g/dL   HCT 40.2 39.0 - 52.0 %   MCV 76.3 (L) 80.0 - 100.0 fL   MCH 23.1 (L) 26.0 - 34.0 pg   MCHC 30.3 30.0 - 36.0 g/dL   RDW 14.3 11.5 - 15.5 %   Platelets 249 150 - 400 K/uL   nRBC 0.0 0.0 - 0.2 %    Comment: Performed at [facility], [address] [place], [place] [address]  Troponin I     Status: None   Collection Time: 04/22/18  9:20 PM  Result Value Ref Range   Troponin I <0.03 <0.03 ng/mL    Comment: Performed at [facility], [address] [place],  [address]  Protime-INR     Status: None   Collection Time: 04/22/18  9:20 PM  Result Value Ref Range    Prothrombin Time 12.7 11.4 - 15.2 seconds   INR 0.97     Comment: Performed at [facility], [address] [place], [place] [address]  APTT     Status: None   Collection Time: 04/22/18  9:20 PM  Result Value Ref Range   aPTT 31 24 - 36 seconds    Comment: Performed at [facility], [address] [place], [place] [address]  Urinalysis, Routine w reflex microscopic     Status: Abnormal   Collection Time: 04/22/18 10:01 PM  Result Value Ref Range   Color, Urine YELLOW YELLOW   APPearance CLEAR CLEAR   Specific Gravity, Urine 1.020 1.005 - 1.030   pH 6.5 5.0 - 8.0   Glucose, UA NEGATIVE NEGATIVE mg/dL   Hgb urine dipstick NEGATIVE NEGATIVE   Bilirubin Urine NEGATIVE NEGATIVE   Ketones, ur NEGATIVE NEGATIVE mg/dL   Protein, ur NEGATIVE NEGATIVE mg/dL   Nitrite NEGATIVE NEGATIVE   Leukocytes, UA TRACE (A) NEGATIVE    Comment: Performed at [facility], [address] [place], [place] [address]  Urinalysis, Microscopic (reflex)     Status: Abnormal   Collection Time: 04/22/18 10:01 PM  Result Value Ref Range   RBC / HPF 0-5 0 - 5 RBC/hpf   WBC, UA 11-20 0 - 5 WBC/hpf   Bacteria, UA FEW (A) NONE SEEN   Squamous Epithelial / LPF 0-5 0 - 5   Mucus PRESENT     Comment: Performed at [facility], [address] [place], [place] [address]   Ct Head Wo Contrast  Result Date: 04/22/2018 CLINICAL DATA:  62 y/o M; intermittent dizziness. Weakness in the right leg. EXAM: CT HEAD WITHOUT CONTRAST TECHNIQUE: Contiguous axial images were obtained from the base of the skull through the vertex without intravenous contrast. COMPARISON:  None. FINDINGS: Brain: No evidence of acute infarction, hemorrhage, hydrocephalus, extra-axial collection or mass lesion/mass effect. Vascular: No hyperdense vessel or unexpected calcification. Skull: Normal. Negative for fracture or focal lesion. Sinuses/Orbits: No acute finding. Other: None. IMPRESSION: No acute  intracranial abnormality identified. Unremarkable CT of the head for age. Electronically Signed   By: Kristine Garbe M.D.   On: 04/22/2018 21:45   Mr Brain Wo Contrast  Result Date: 04/23/2018 CLINICAL DATA:  RIGHT leg numbness and weakness. History of hypertension, off medication. EXAM: MRI HEAD WITHOUT CONTRAST TECHNIQUE: Multiplanar, multiecho pulse sequences of the brain and surrounding structures were obtained without intravenous contrast. COMPARISON:  CT HEAD April 22, 2018 FINDINGS: INTRACRANIAL CONTENTS: 6 mm  ovoid reduced diffusion LEFT thalamus/posterior limb of the internal capsule. No susceptibility artifact to suggest hemorrhage. The ventricles and sulci are normal for patient's age. No suspicious parenchymal signal, masses, mass effect. Scattered subcentimeter supratentorial white matter FLAIR T2 hyperintensities. No abnormal extra-axial fluid collections. No extra-axial masses. VASCULAR: Normal major intracranial vascular flow voids present at skull base. SKULL AND UPPER CERVICAL SPINE: No abnormal sellar expansion. No suspicious calvarial bone marrow signal. Craniocervical junction maintained. SINUSES/ORBITS: Trace paranasal sinus mucosal thickening. Small maxillary mucosal retention cysts. Mastoid air cells are well aerated. Included ocular globes and orbital contents are non-suspicious. OTHER: None. IMPRESSION: 1. 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 2. Mild chronic small vessel ischemic changes. Electronically Signed   By: Elon Alas M.D.   On: 04/23/2018 01:52    Assessment: 62 y.o. male presenting with RLE weakness and sensory numbness. MRI reveals acute left thalamus/IC lacunar infarction. 1. Exam shows subtle RLE weakness and mild RUE dysmetria 2. MRI reveals an acute small LEFT thalamus/internal capsule nonhemorrhagic infarct. Mild chronic small vessel ischemic changes are also appreciated. 3. MRA: Mild intracranial  atherosclerosis 4. Stroke Risk Factors - HTN  Plan: 1. HgbA1c, fasting lipid panel 2. Frequent neuro checks 3. PT consult, OT consult, Speech consult 4. Echocardiogram 5. Carotid dopplers 6. Prophylactic therapy- Start ASA 81 mg po qd. Start atorvastatin 40 mg po qd 7. Risk factor modification 8. Telemetry monitoring 9. Lacunar infarction without significant penumbra. Manage BP as per standard protocol, lowering by 15% per day. Long term BP goal of 120/80.    '@Electronically'$  signed: Dr. Kerney Elbe  04/23/2018, 2:13 AM

## 2018-04-23 NOTE — Evaluation (Signed)
Physical Therapy Evaluation & Discharge Patient Details Name: Ryan Bauer MRN: [id number] DOB: 17-Mar-1956 Today's Date: 04/23/2018   History of Present Illness  Pt is a 62 y/o male admitted with R LE weakness. MRI revealed an acute ischemic stroke in L thalamus / internal capsule. PMH including but not limited to HTN.  Clinical Impression  Pt presented supine in bed with HOB elevated, awake and willing to participate in therapy session. Prior to admission, pt reported that he was independent with all functional mobility and ADLs. 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 Pt currently able to perform bed mobility and transfers independently. He ambulated in hallway without an AD with min guard to supervision level for safety. Pt also completed stair training with no difficulties. MMT revealed 5/5 strength in bilateral LEs and bilateral UEs. No further acute PT needs identified at this time. PT signing off.     Follow Up Recommendations No PT follow up    Equipment Recommendations  None recommended by PT    Recommendations for Other Services       Precautions / Restrictions Precautions Precautions: Fall Restrictions Weight Bearing Restrictions: No      Mobility  Bed Mobility Overal bed mobility: Independent                Transfers Overall transfer level: Independent                  Ambulation/Gait Ambulation/Gait assistance: Supervision;Min guard Gait Distance (Feet): 200 Feet Assistive device: None Gait Pattern/deviations: Step-through pattern;Decreased stride length;Drifts right/left Gait velocity: WFL   General Gait Details: pt initially with minor instability requiring min guard for safety, progressing to supervision  Stairs Stairs: Yes Stairs assistance: Supervision Stair Management: Two rails;Alternating pattern;Forwards Number of Stairs: 2(2 standard size steps x2 and 3 half steps x2)    Wheelchair Mobility    Modified  Rankin (Stroke Patients Only) Modified Rankin (Stroke Patients Only) Pre-Morbid Rankin Score: No symptoms Modified Rankin: No significant disability     Balance Overall balance assessment: Needs assistance Sitting-balance support: Feet supported Sitting balance-Leahy Scale: Good     Standing balance support: No upper extremity supported Standing balance-Leahy Scale: Good               High level balance activites: Direction changes;Turns;Head turns High Level Balance Comments: min guard to supervision for safety             Pertinent Vitals/Pain Pain Assessment: No/denies pain    Home Living Family/patient expects to be discharged to:: Private residence Living Arrangements: Alone Available Help at Discharge: Family;Friend(s);Available 24 hours/day Type of Home: House Home Access: Level entry     Home Layout: Able to live on main level with bedroom/bathroom Home Equipment: None      Prior Function Level of Independence: Independent         Comments: works Copywriter, advertising business doing Veterinary surgeon   Dominant Hand: Right    Extremity/Trunk Assessment   Upper Extremity Assessment Upper Extremity Assessment: Overall WFL for tasks assessed    Lower Extremity Assessment Lower Extremity Assessment: Overall WFL for tasks assessed    Cervical / Trunk Assessment Cervical / Trunk Assessment: Normal  Communication   Communication: No difficulties  Cognition Arousal/Alertness: Awake/alert Behavior During Therapy: WFL for tasks assessed/performed Overall Cognitive Status: Within Functional Limits for tasks assessed  General Comments      Exercises     Assessment/Plan    PT Assessment Patent does not need any further PT services  PT Problem List         PT Treatment Interventions      PT Goals (Current goals can be found in the Care Plan section)  Acute Rehab PT  Goals Patient Stated Goal: return home and to work    Frequency     Barriers to discharge        Co-evaluation               AM-PAC PT "6 Clicks" Daily Activity  Outcome Measure Difficulty turning over in bed (including adjusting bedclothes, sheets and blankets)?: None Difficulty moving from lying on back to sitting on the side of the bed? : None Difficulty sitting down on and standing up from a chair with arms (e.g., wheelchair, bedside commode, etc,.)?: None Help needed moving to and from a bed to chair (including a wheelchair)?: None Help needed walking in hospital room?: None Help needed climbing 3-5 steps with a railing? : None 6 Click Score: 24    End of Session Equipment Utilized During Treatment: Gait belt Activity Tolerance: Patient tolerated treatment well Patient left: Other (comment)(in therapy gym with OT) Nurse Communication: Mobility status PT Visit Diagnosis: Other symptoms and signs involving the nervous system (A54.098)    Time: 1191-4782 PT Time Calculation (min) (ACUTE ONLY): 13 min   Charges:   PT Evaluation $PT Eval Moderate Complexity: 1 Mod          Deborah Chalk, PT, DPT  Acute Rehabilitation Services Pager [phone] Office [phone]    Alessandra Bevels Kiarah Eckstein 04/23/2018, 4:18 PM

## 2018-04-24 ENCOUNTER — Inpatient Hospital Stay (HOSPITAL_COMMUNITY): Payer: Self-pay

## 2018-04-24 DIAGNOSIS — I639 Cerebral infarction, unspecified: Secondary | ICD-10-CM

## 2018-04-24 LAB — RAPID URINE DRUG SCREEN, HOSP PERFORMED
Amphetamines: NOT DETECTED
BARBITURATES: NOT DETECTED
Benzodiazepines: NOT DETECTED
Cocaine: NOT DETECTED
Opiates: NOT DETECTED
TETRAHYDROCANNABINOL: NOT DETECTED

## 2018-04-24 MED ORDER — AMLODIPINE BESYLATE 10 MG PO TABS
10.0000 mg | ORAL_TABLET | Freq: Every day | ORAL | Status: DC
  Administered 2018-04-24: 10 mg via ORAL
  Filled 2018-04-24: qty 1

## 2018-04-24 MED ORDER — AMLODIPINE BESYLATE 10 MG PO TABS: 10.0000 mg | ORAL_TABLET | Freq: Every day | ORAL | 1 refills | Status: AC

## 2018-04-24 MED ORDER — ATORVASTATIN CALCIUM 40 MG PO TABS: 40.0000 mg | ORAL_TABLET | Freq: Every day | ORAL | 1 refills | Status: DC

## 2018-04-24 MED ORDER — CLOPIDOGREL BISULFATE 75 MG PO TABS: 75.0000 mg | ORAL_TABLET | Freq: Every day | ORAL | 1 refills | Status: DC

## 2018-04-24 MED ORDER — ASPIRIN 81 MG PO TBEC: 81.0000 mg | DELAYED_RELEASE_TABLET | Freq: Every day | ORAL | 2 refills | Status: AC

## 2018-04-24 NOTE — Progress Notes (Signed)
VASCULAR LAB PRELIMINARY  PRELIMINARY  PRELIMINARY  PRELIMINARY  Carotid duplex completed.    Preliminary report:  1-39% ICA plaquing. Vertebral artery flow is antegrade.  Jhada Risk, RVT 04/24/2018, 11:20 AM

## 2018-04-24 NOTE — Care Management Note (Signed)
Case Management Note  Patient Details  Name: Ryan Bauer MRN: [id number] Date of Birth: 1956-06-09  Subjective/Objective:                    Action/Plan:  Provided with MATCH, no other CM needs identified Expected Discharge Date:  04/24/18               Expected Discharge Plan:     In-House Referral:     Discharge planning Services  CM Consult, [facility] Program  Post Acute Care Choice:    Choice offered to:     DME Arranged:    DME Agency:     HH Arranged:    HH Agency:     Status of Service:  Completed, signed off  If discussed at [employer], dates discussed:    Additional Comments:  Lawerance Sabal, RN 04/24/2018, 3:04 PM

## 2018-04-24 NOTE — Discharge Summary (Signed)
Ryan Bauer, is a 62 y.o. male  DOB 03/13/1956  MRN [id number].  Admission date:  04/22/2018  Admitting Physician  Hillary Bow, DO  Discharge Date:  04/24/2018   Primary MD  Joaquin Courts, DO  Recommendations for primary care physician for things to follow:   BP control, follow up lipid and LFTs in 2-3 months   Admission Diagnosis  Elevated blood pressure reading [R03.0] Right leg weakness [R29.898] Acute ischemic stroke [facility]) [I63.9] Acute CVA (cerebrovascular accident) [facility]) [I63.9]   Discharge Diagnosis  Elevated blood pressure reading [R03.0] Right leg weakness [R29.898] Acute ischemic stroke (HCC) [I63.9] Acute CVA (cerebrovascular accident) (HCC) [I63.9]    Principal Problem:   Acute ischemic stroke (HCC) Active Problems:   HTN (hypertension)      Past Medical History:  Diagnosis Date  . Hypertension     History reviewed. No pertinent surgical history.     HPI  from the history and physical done on the day of admission:  Ryan Bauer is a 62 y.o. male with medical history significant of HTN.  Patient presents to the ED with c/o "loss of balance" in his R leg.  Symptoms onset when he got up in the AM of 11/1 around 5am.  Symptoms persistent throughout the day today.  Pins and needles sensation in R leg.  Presents to ED.   ED Course: MRI brain shows acute ischemic stroke in L thalamus / internal capsule.   Review of Systems: As per HPI otherwise 10 point review of systems negative.       Past Medical History:  Diagnosis Date  . Hypertension      Hospital Course:   Ryan Bauer is a 62 y.o. male with history of hypertension (had not been taking medication) presenting with elevated BP, dizziness and right LE weakness with numbness. He did not receive IV t-PA due to late presentation.  Stroke: small LEFT thalamus/internal capsule - small vessel  disease.  Resultant  Back to baseline  CT head   - no acute findings.  MRI head - Acute small LEFT thalamus/internal capsule nonhemorrhagic infarct.   MRA head - Mild intracranial atherosclerosis.   Carotid Doppler unremarkable   2D Echo  - EF 60 - 65%. No cardiac source of emboli identified.   UDS - negative  LDL - 133  HIV neg  HgbA1c - 6.6  VTE prophylaxis - Lovenox  Diet  - Heart healthy with thin liquids.  No antithrombotic prior to admission, now on aspirin 81 mg daily and Plavix 75 mg daily. Continue DAPT for 3 months and then ASA alone  Patient counseled to be compliant with his antithrombotic medications  Ongoing aggressive stroke risk factor management  Therapy recommendations:  No f/u therapies recommended   Hypertension  Elevated   Started on amlodipine 10 mg daily  Educated on BP monitoring at home  Close PCP follow up   Long-term BP goal normotensive  Hyperlipidemia  Lipid lowering medication PTA:  none  LDL 133, goal < 70  Current lipid lowering medication: Lipitor 40 mg daily  Continue statin at discharge  Other Stroke Risk Factors  Advanced age  Family hx stroke (mother and brother)  Medical non compliance -counselled  Other Active Problems Discharge Condition: improved and satisfactory  Follow UP - pcp in 1-2 weeks  Follow-up Information    [facility]. Schedule an appointment as soon as possible for a visit in 4 week(s).   Specialty:  Neurology Contact information: [address] [place] [address] [phone]           Consults obtained - neurology  Diet and Activity recommendation:  As advised  Discharge Instructions    Discharge Instructions    Ambulatory referral to Neurology   Complete by:  As directed    Follow up with stroke clinic NP (Jessica Vanschaick or Darrol Angel, if both not available, consider Manson Allan, or Ahern) at [facility] in about 4  weeks. Thanks.   Call MD for:  difficulty breathing, headache or visual disturbances   Complete by:  As directed    Call MD for:  extreme fatigue   Complete by:  As directed    Call MD for:  hives   Complete by:  As directed    Call MD for:  persistant dizziness or light-headedness   Complete by:  As directed    Call MD for:  persistant nausea and vomiting   Complete by:  As directed    Call MD for:  redness, tenderness, or signs of infection (pain, swelling, redness, odor or green/yellow discharge around incision site)   Complete by:  As directed    Call MD for:  severe uncontrolled pain   Complete by:  As directed    Call MD for:  temperature >100.4   Complete by:  As directed    Diet - low sodium heart healthy   Complete by:  As directed    Increase activity slowly   Complete by:  As directed         Discharge Medications     Allergies as of 04/24/2018   No Known Allergies     Medication List    TAKE these medications   amLODipine 10 MG tablet Commonly known as:  NORVASC Take 1 tablet (10 mg total) by mouth daily. Start taking on:  04/25/2018   aspirin 81 MG EC tablet Take 1 tablet (81 mg total) by mouth daily. Start taking on:  04/25/2018   atorvastatin 40 MG tablet Commonly known as:  LIPITOR Take 1 tablet (40 mg total) by mouth daily at 6 PM.   clopidogrel 75 MG tablet Commonly known as:  PLAVIX Take 1 tablet (75 mg total) by mouth daily. Start taking on:  04/25/2018       Major procedures and Radiology Reports - PLEASE review detailed and final reports for all details, in brief -     Dg Chest 2 View  Result Date: 04/23/2018 CLINICAL DATA:  62 year old male with intermittent dizziness. EXAM: CHEST - 2 VIEW COMPARISON:  None. FINDINGS: The heart size and mediastinal contours are within normal limits. Both lungs are clear. The visualized skeletal structures are unremarkable. IMPRESSION: No active cardiopulmonary disease. Electronically Signed   By: Elgie Collard M.D.   On: 04/23/2018 03:56   Ct Head Wo Contrast  Result Date: 04/22/2018 CLINICAL DATA:  62 y/o M; intermittent dizziness. Weakness in the right leg. EXAM: CT HEAD WITHOUT CONTRAST TECHNIQUE: Contiguous axial images were obtained from the  base of the skull through the vertex without intravenous contrast. COMPARISON:  None. FINDINGS: Brain: No evidence of acute infarction, hemorrhage, hydrocephalus, extra-axial collection or mass lesion/mass effect. Vascular: No hyperdense vessel or unexpected calcification. Skull: Normal. Negative for fracture or focal lesion. Sinuses/Orbits: No acute finding. Other: None. IMPRESSION: No acute intracranial abnormality identified. Unremarkable CT of the head for age. Electronically Signed   By: Mitzi Hansen M.D.   On: 04/22/2018 21:45   Mr Brain Wo Contrast  Result Date: 04/23/2018 CLINICAL DATA:  RIGHT leg numbness and weakness. History of hypertension, off medication. EXAM: MRI HEAD WITHOUT CONTRAST TECHNIQUE: Multiplanar, multiecho pulse sequences of the brain and surrounding structures were obtained without intravenous contrast. COMPARISON:  CT HEAD April 22, 2018 FINDINGS: INTRACRANIAL CONTENTS: 6 mm ovoid reduced diffusion LEFT thalamus/posterior limb of the internal capsule. No susceptibility artifact to suggest hemorrhage. The ventricles and sulci are normal for patient's age. No suspicious parenchymal signal, masses, mass effect. Scattered subcentimeter supratentorial white matter FLAIR T2 hyperintensities. No abnormal extra-axial fluid collections. No extra-axial masses. VASCULAR: Normal major intracranial vascular flow voids present at skull base. SKULL AND UPPER CERVICAL SPINE: No abnormal sellar expansion. No suspicious calvarial bone marrow signal. Craniocervical junction maintained. SINUSES/ORBITS: Trace paranasal sinus mucosal thickening. Small maxillary mucosal retention cysts. Mastoid air cells are well aerated. Included ocular  globes and orbital contents are non-suspicious. OTHER: None. IMPRESSION: 1. 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 2. Mild chronic small vessel ischemic changes. Electronically Signed   By: Awilda Metro M.D.   On: 04/23/2018 01:52   Mr Maxine Glenn Head Wo Contrast  Result Date: 04/23/2018 CLINICAL DATA:  Follow up stroke. History of hypertension, off medication. EXAM: MRA HEAD WITHOUT CONTRAST TECHNIQUE: Angiographic images of the Circle of Willis were obtained using MRA technique without intravenous contrast. COMPARISON:  MRI head April 23, 2018 FINDINGS: ANTERIOR CIRCULATION: Normal flow related enhancement of the included cervical, petrous, cavernous and supraclinoid internal carotid arteries. Patent anterior communicating artery. Patent anterior and middle cerebral arteries, mild luminal irregularity compatible with atherosclerosis. No large vessel occlusion, flow limiting stenosis, aneurysm. POSTERIOR CIRCULATION: LEFT vertebral artery is dominant. Vertebrobasilar arteries are patent, with normal flow related enhancement of the main branch vessels. Patent posterior cerebral arteries, mild luminal irregularity compatible with atherosclerosis. No large vessel occlusion, flow limiting stenosis,  aneurysm. ANATOMIC VARIANTS: None. Source images and MIP images were reviewed. IMPRESSION: 1. No emergent large vessel occlusion. 2. Mild intracranial atherosclerosis. Electronically Signed   By: Awilda Metro M.D.   On: 04/23/2018 05:24    Micro Results     No results found for this or any previous visit (from the past 240 hour(s)).     Today   Subjective    Ryan Bauer today has no dizziness, no weakness, no paresthesias   Patient has been seen and examined prior to discharge   Objective   Blood pressure (!) 151/97, pulse 70, temperature 98.4 F (36.9 C), temperature source Oral, resp. rate 20, height 5\' 8"  (1.727 m), weight 86.2 kg, SpO2 100  %.   Intake/Output Summary (Last 24 hours) at 04/24/2018 1447 Last data filed at 04/24/2018 1200 Gross per 24 hour  Intake 300 ml  Output -  Net 300 ml    Exam Gen:- Awake , NAD HEENT:- [place].AT, MMM Neck-Supple Neck,No JVD,  Lungs- mostly clear CV- S1, S2 normal Abd-  +ve B.Sounds, Abd Soft, No tenderness,    Extremity/Skin:- Intact peripheral pulses    Data Review   CBC w  Diff:  Lab Results  Component Value Date   WBC 5.0 04/22/2018   HGB 12.2 (L) 04/22/2018   HCT 40.2 04/22/2018   PLT 249 04/22/2018    CMP:  Lab Results  Component Value Date   NA 139 04/22/2018   K 3.5 04/22/2018   CL 103 04/22/2018   CO2 26 04/22/2018   BUN 12 04/22/2018   CREATININE 1.07 04/22/2018  .   Total Discharge time is about 33 minutes  Jackie Plum M.D on 04/24/2018 at 2:47 PM  [facility]   Office  [phone]  Dragon dictation system was used to create this note, attempts have been made to correct errors, however presence of uncorrected errors is not a reflection quality of care provided

## 2018-04-24 NOTE — Progress Notes (Signed)
P.t. Discharged from unit via wheelchair. Discharge instructions and education provided.

## 2018-06-03 ENCOUNTER — Ambulatory Visit: Payer: Self-pay | Admitting: Adult Health

## 2018-06-07 ENCOUNTER — Ambulatory Visit (INDEPENDENT_AMBULATORY_CARE_PROVIDER_SITE_OTHER): Payer: PRIVATE HEALTH INSURANCE | Admitting: Adult Health

## 2018-06-07 ENCOUNTER — Encounter: Payer: Self-pay | Admitting: Adult Health

## 2018-06-07 VITALS — BP 150/78 | HR 82 | Ht 69.0 in | Wt 191.4 lb

## 2018-06-07 DIAGNOSIS — I10 Essential (primary) hypertension: Secondary | ICD-10-CM

## 2018-06-07 DIAGNOSIS — R5382 Chronic fatigue, unspecified: Secondary | ICD-10-CM

## 2018-06-07 DIAGNOSIS — E785 Hyperlipidemia, unspecified: Secondary | ICD-10-CM | POA: Diagnosis not present

## 2018-06-07 DIAGNOSIS — I639 Cerebral infarction, unspecified: Secondary | ICD-10-CM

## 2018-06-07 MED ORDER — ROSUVASTATIN CALCIUM 10 MG PO TABS: 10.0000 mg | ORAL_TABLET | Freq: Every day | ORAL | 3 refills | Status: AC

## 2018-06-07 NOTE — Patient Instructions (Signed)
Continue aspirin 81 mg daily  and start Crestor 10mg   for secondary stroke prevention  Stop plavix and stop lipitor  Referral placed to undergo sleep study - you will be called to schedule sleep consult  Continue to stay active as tolerated and maintain a healthy diet  Continue to follow up with PCP regarding cholesterol and blood pressure management   Continue to monitor blood pressure at home  Maintain strict control of hypertension with blood pressure goal below 130/90, diabetes with hemoglobin A1c goal below 6.5% and cholesterol with LDL cholesterol (bad cholesterol) goal below 70 mg/dL. I also advised the patient to eat a healthy diet with plenty of whole grains, cereals, fruits and vegetables, exercise regularly and maintain ideal body weight.  Followup in the future with me in 3 months or call earlier if needed       [facility]. I hope we have been able to provide you high quality care today.  You may receive a patient satisfaction survey over the next few weeks. We would appreciate your feedback and comments so that we may continue to improve ourselves and the health of our patients.

## 2018-06-07 NOTE — Progress Notes (Signed)
[facility] [address] [place]. [place] [address] [phone]       OFFICE FOLLOW UP NOTE  [name] Date of Birth:  01/23/1956 Medical Record Number:  [id number]   Reason for Referral:  hospital stroke follow up  CHIEF COMPLAINT:  Chief Complaint  Patient presents with  . 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 History obtained from patient and chart review. Reviewed all radiology images and labs personally.  [name] is a 62 y.o. male with history of hypertension (had not been taking medication) presenting with elevated BP, dizziness and right LE weakness with numbness. He did not receive IV t-PA due to late presentation.  CT had reviewed and was negative for acute infarct.  MRI brain reviewed and showed acute small left thalamic/internal capsule nonhemorrhagic infarct likely secondary to small vessel disease.  MRA head showed mild intracranial atherosclerosis.  Carotid Dopplers unremarkable.  2D echo showed an EF of 60 to 65% without cardiac source of embolus identified.  UDS negative.  LDL 133 and A1c 6.6.  Patient was not on antithrombotic PTA and recommended DAPT and then aspirin alone.  It was also recommended to initiate atorvastatin 40 mg daily for HLD management.  As all symptoms resolved, patient was discharged home in stable condition without therapy needs.  Patient is being seen today for hospital follow-up. He states overall he has been doing okay but feels as though the medications have been causing dizziness and fatigue along with increased urination. He does state that he may not be getting enough sleep at night recently. He returned to working approx 1  week ago but does feel as though he over did it as he had excessive fatigue and aching in his muscles. He has been experiencing right leg "tightness" sensation that can also be present in his abdomen. This does occur almost daily. This sensation can last up to hours before it resolves. He did undergo stress test last week and is currently wearing heart monitor for 2 weeks. He underwent the stress test due to general fatigue that worsens with activity.  He states since hospital discharge, he has been having a difficult time doing any type of activity without feeling excessively fatigued without feeling slightly increased right lower extremity weakness along with possible weakness in right hand.  He does state prior to his stroke, he would fall asleep easily while watching TV or riding in a car.  He does endorse snoring.  He denies ever undergoing sleep study.  He has continued on both aspirin and Plavix without side effects of bleeding or bruising.  He has continued on atorvastatin and unsure if he is experiencing statin myalgias or spasticity in right lower extremity.  Blood pressure today slightly elevated at 150/78 but he does monitor at home and SBP typically range 130-140.  No further concerns at this time.  Denies new or worsening stroke/TIA symptoms.    ROS:   14 system review of systems performed and negative with exception of weakness, dizziness, frequency of urination and fatigue  PMH:  Past Medical History:  Diagnosis Date  . Hypertension     PSH: History reviewed. No pertinent  surgical history.  Social History:  Social History   Socioeconomic History  . Marital status: Single    Spouse name: Not on file  . Number of children: Not on file  . Years of education: Not on file  . Highest education level: Not on file  Occupational History  . Not on file  Social Needs  . Financial resource strain: Not on file  . Food insecurity:    Worry: Not on file    Inability: Not on file  .  Transportation needs:    Medical: Not on file    Non-medical: Not on file  Tobacco Use  . Smoking status: Never Smoker  . Smokeless tobacco: Never Used  Substance and Sexual Activity  . Alcohol use: Never    Frequency: Never  . Drug use: Never  . Sexual activity: Not on file  Lifestyle  . Physical activity:    Days per week: Not on file    Minutes per session: Not on file  . Stress: Not on file  Relationships  . Social connections:    Talks on phone: Not on file    Gets together: Not on file    Attends religious service: Not on file    Active member of club or organization: Not on file    Attends meetings of clubs or organizations: Not on file    Relationship status: Not on file  . Intimate partner violence:    Fear of current or ex partner: Not on file    Emotionally abused: Not on file    Physically abused: Not on file    Forced sexual activity: Not on file  Other Topics Concern  . Not on file  Social History Narrative  . Not on file    Family History:  Family History  Problem Relation Age of Onset  . Stroke Mother   . Stroke Brother     Medications:   Current Outpatient Medications on File Prior to Visit  Medication Sig Dispense Refill  . amLODipine (NORVASC) 10 MG tablet Take 1 tablet (10 mg total) by mouth daily. 30 tablet 1  . aspirin EC 81 MG EC tablet Take 1 tablet (81 mg total) by mouth daily. 30 tablet 2   No current facility-administered medications on file prior to visit.     Allergies:  No Known Allergies   Physical Exam  Vitals:   06/07/18 1012  BP: (!) 150/78  Pulse: 82  Weight: 191 lb 6.4 oz (86.8 kg)  Height: 5\' 9"  (1.753 m)   Body mass index is 28.26 kg/m. No exam data present  General: well developed, well nourished, pleasant middle-aged African-American male, seated, in no evident distress Head: head normocephalic and atraumatic.   Neck: supple with no carotid or supraclavicular bruits Cardiovascular: regular rate and rhythm,  no murmurs Musculoskeletal: no deformity Skin:  no rash/petichiae Vascular:  Normal pulses all extremities  Neurologic Exam Mental Status: Awake and fully alert. Oriented to place and time. Recent and remote memory intact. Attention span, concentration and fund of knowledge appropriate. Mood and affect appropriate.  Cranial Nerves: Fundoscopic exam reveals sharp disc margins. Pupils equal, briskly reactive to light. Extraocular movements full without nystagmus. Visual fields full to confrontation. Hearing intact. Facial sensation intact. Face, tongue, palate moves normally and symmetrically.  Motor: Normal bulk and tone. Normal strength in all tested extremity muscles.  No weakness noted on exam. Sensory.: intact to touch , pinprick , position and vibratory sensation.  Coordination: Rapid alternating movements  normal in all extremities. Finger-to-nose and heel-to-shin performed accurately bilaterally.  Fine action tremor noted left upper extremity which is chronic per patient Gait and Station: Arises from chair without difficulty. Stance is normal. Gait demonstrates normal stride length and balance. Able to heel, toe and tandem walk without difficulty.  Reflexes: 1+ and symmetric. Toes downgoing.    NIHSS  0 Modified Rankin  2    Diagnostic Data (Labs, Imaging, Testing)  Ct Head Wo Contrast 04/22/2018 IMPRESSION:  No acute intracranial abnormality identified. Unremarkable CT of the head for age.    Mr Brain Wo Contrast 04/23/2018 IMPRESSION:  1. 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  2. Mild chronic small vessel ischemic changes.    Mr Maxine GlennMra Head Wo Contrast 04/23/2018 IMPRESSION:  1. No emergent large vessel occlusion.  2. Mild intracranial atherosclerosis.    Dg Chest 2 View 04/23/2018 IMPRESSION:  No active cardiopulmonary disease.    Transthoracic Echocardiogram 04/23/2018 Study Conclusions - Left ventricle: The cavity size was  normal. Wall thickness was normal. Systolic function was normal. The estimated ejection fraction was in the range of 60% to 65%. Wall motion was normal; there were no regional wall motion abnormalities. Doppler parameters are consistent with abnormal left ventricular relaxation (grade 1 diastolic dysfunction). - Aortic valve: Valve area (VTI): 2.86 cm^2. Valve area (Vmax): 2.95 cm^2. Valve area (Vmean): 2.76 cm^2. - Right ventricle: The cavity size was mildly dilated.    Bilateral Carotid Dopplers  1-39% ICA plaquing. Vertebral artery flow is antegrade.   ASSESSMENT: Ryan Bauer is a 62 y.o. year old male here with left thalamic/internal capsule infarct on 04/23/2018 secondary to small vessel disease. Vascular risk factors include HTN and HLD.  Patient is being seen today for hospital follow-up with waxing/waning of right-sided weakness especially towards the end of the day but greatest complaint today is excessive daytime fatigue.    PLAN:  1. Left thalamic/internal capsule infarct: Continue aspirin 81 mg daily for secondary stroke prevention.  Patient was advised to discontinue Plavix at this time as 3 weeks DAPT completed.  Due to possible side effects from atorvastatin, recommended to discontinue and initiate Crestor 10 mg daily.  Maintain strict control of hypertension with blood pressure goal below 130/90 and cholesterol with LDL cholesterol (bad cholesterol) goal below 70 mg/dL.  I also advised the patient to eat a healthy diet with plenty of whole grains, cereals, fruits and vegetables, exercise regularly with at least 30 minutes of continuous activity daily and maintain ideal body weight. 2. HTN: Advised to continue current treatment regimen.  Today's BP 150/78.  Advised to continue to monitor at home along with continued follow-up with PCP for management 3. HLD: Due to possible side effects from Lipitor, recommended to discontinue and initiate Crestor 10 mg daily.   Advised patient that he will continue to follow with PCP for HLD management 4. Excessive daytime fatigue: Possible underlying sleep disorder such as sleep apnea and therefore was referred to [facility] sleep clinic for sleep consult.  He is also advised that his worsening of daytime fatigue could possibly be post stroke symptom and can gradually improve over time but as he was experiencing daytime fatigue prior to his stroke, it is highly encouraged for him to undergo sleep study to rule out possible underlying sleep disorder.  Reviewed risk factors of untreated sleep apnea 5. Discussion with patient regarding normal waxing/waning of symptoms especially with increased activity or fatigue.  Educated on importance of if he experiences new neurological symptoms,  he should be evaluated in ED.  Stress reduction also discussed as he is fearful of recurrent stroke.  Advised him at this time to be active as tolerated and to ensure he is taking frequent breaks to avoid worsening of his symptoms.   Follow up in 3 months or call earlier if needed   Greater than 50% of time during this 25 minute visit was spent on counseling, explanation of diagnosis of left thalamic/internal capsule infarct, reviewing risk factor management of HLD and HTN, planning of further management along with potential future management, and discussion with patient and family answering all questions.    George Hugh, AGNP-BC  [facility] [address] [place], [place] [address]  Phone [phone] Fax [phone] Note: This document was prepared with digital dictation and possible smart phrase technology. Any transcriptional errors that result from this process are unintentional.

## 2018-06-07 NOTE — Progress Notes (Signed)
I agree with the above plan 

## 2018-08-04 ENCOUNTER — Telehealth: Payer: Self-pay

## 2018-08-04 ENCOUNTER — Institutional Professional Consult (permissible substitution): Payer: PRIVATE HEALTH INSURANCE | Admitting: Neurology

## 2018-08-04 NOTE — Telephone Encounter (Signed)
Pt did not show for their appt with Dr. Athar today.  

## 2018-08-08 ENCOUNTER — Encounter: Payer: Self-pay | Admitting: Neurology

## 2018-09-06 ENCOUNTER — Ambulatory Visit: Payer: PRIVATE HEALTH INSURANCE | Admitting: Adult Health

## 2020-09-28 IMAGING — MR MR MRA HEAD W/O CM
1 series · 23 of 48 positions shown · non-contrast
Comparison: MRI head April 23, 2018

CLINICAL DATA: Follow up stroke. History of hypertension, off
medication.

EXAM:
MRA HEAD WITHOUT CONTRAST
TECHNIQUE: Angiographic images of the Circle of Willis were obtained using MRA
technique without intravenous contrast.

[Series 5: 3d cow · axial · 0.5mm · 0.41mm/px · z∈[-8,+82]mm · 23 of 188 slices shown]
[im 1/188]
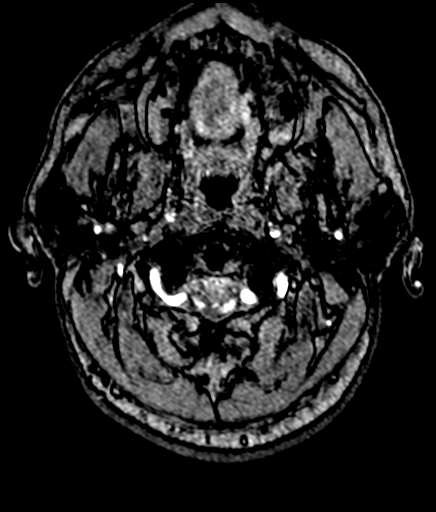
[im 4/188]
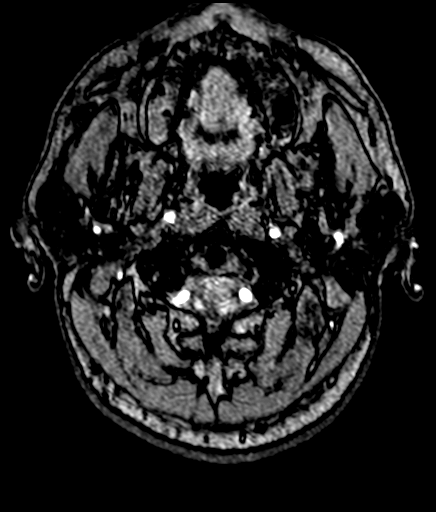
[im 8/188]
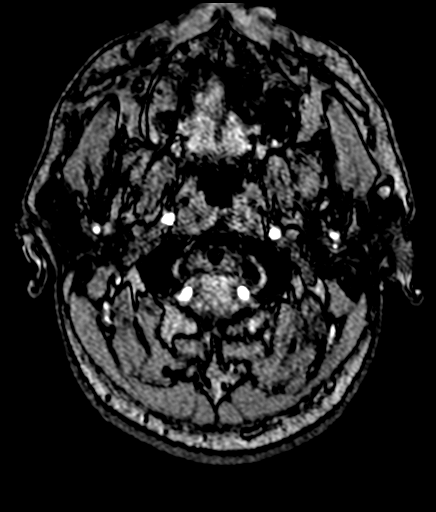
[im 12/188]
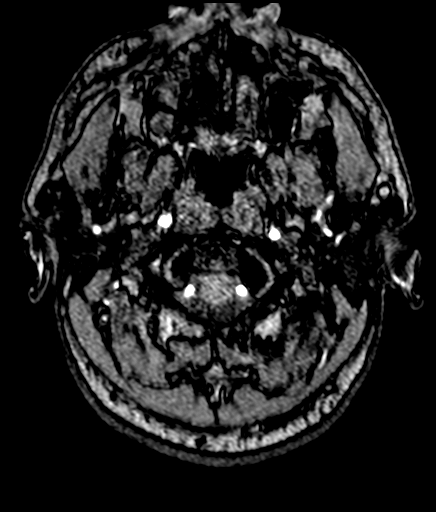
[im 16/188]
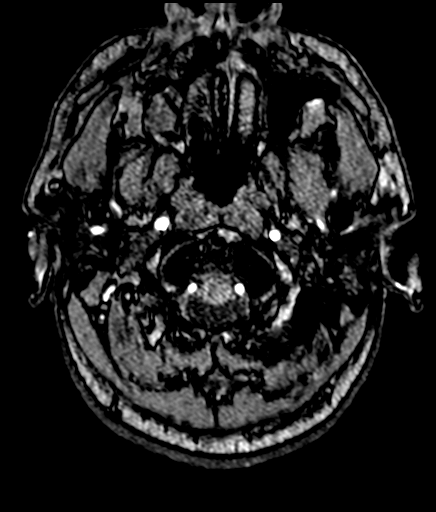
[im 20/188]
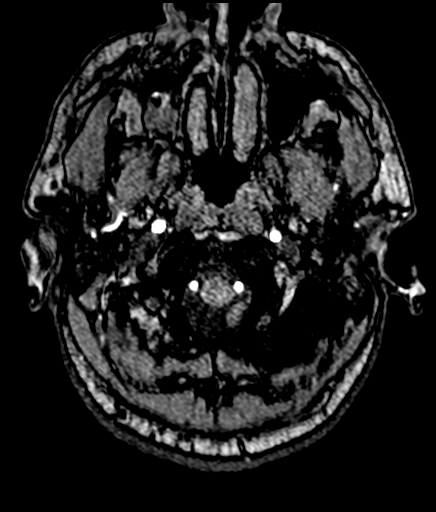
[im 24/188]
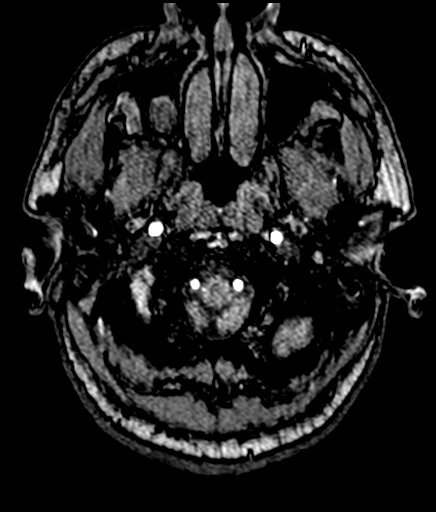
[im 28/188]
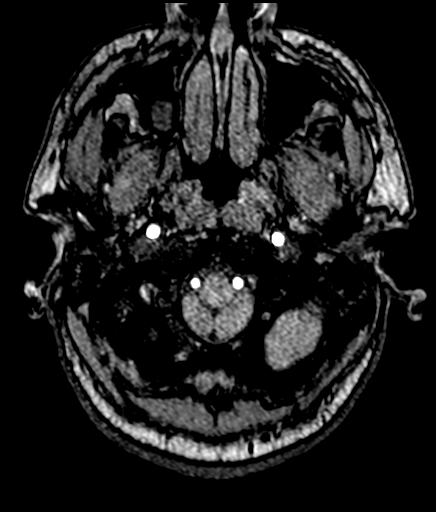
[im 32/188]
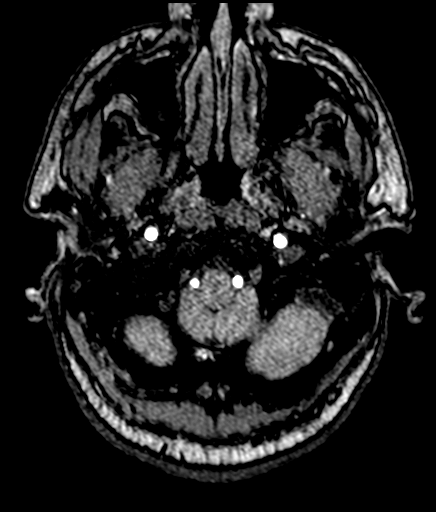
[im 36/188]
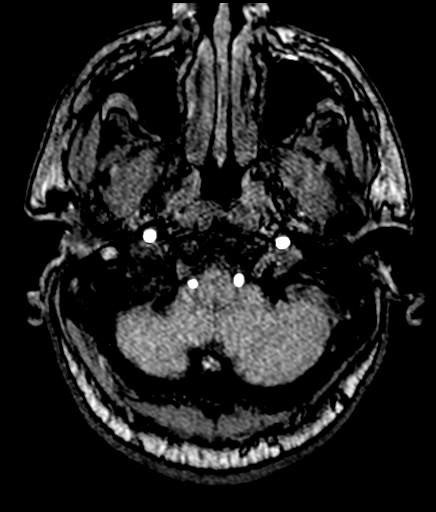
[im 40/188]
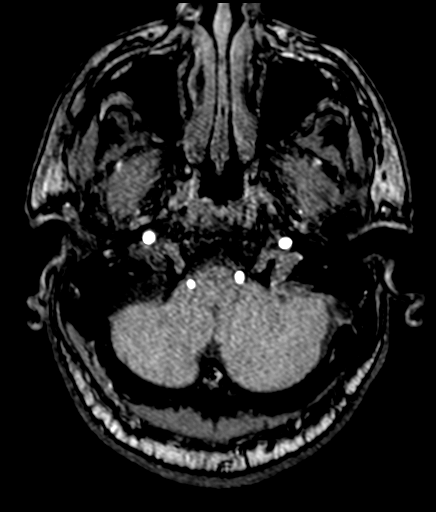
[im 44/188]
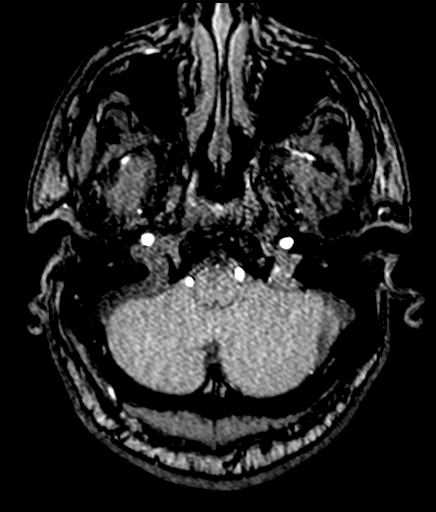
[im 48/188]
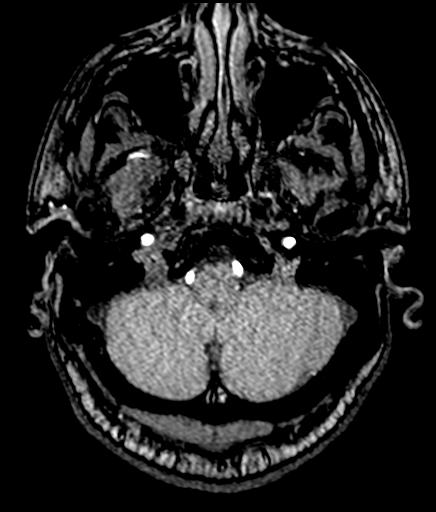
[im 52/188]
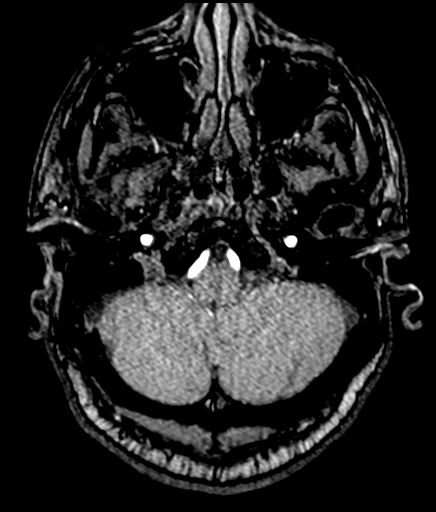
[im 56/188]
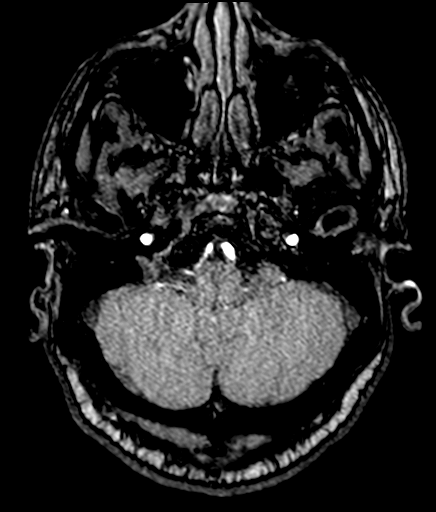
[im 60/188]
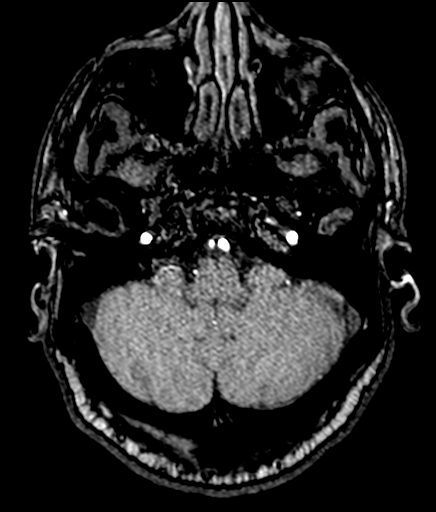
[im 84/188]
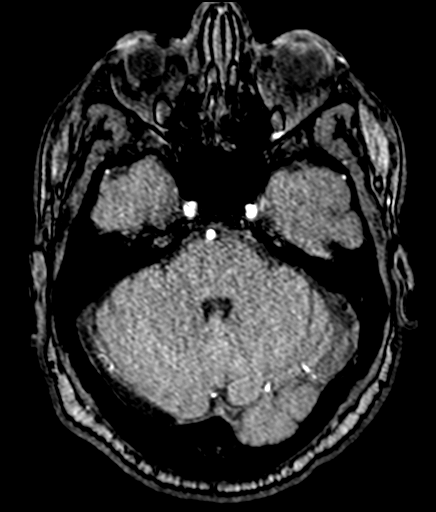
[im 96/188]
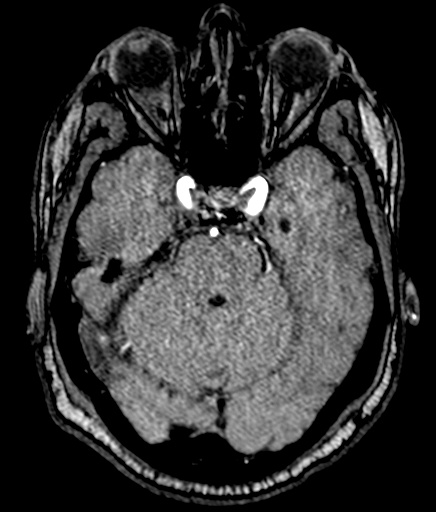
[im 108/188]
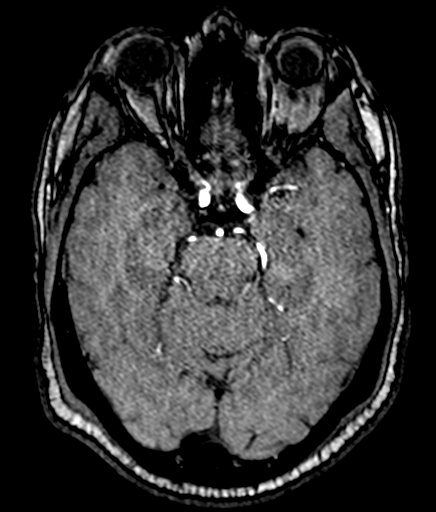
[im 132/188]
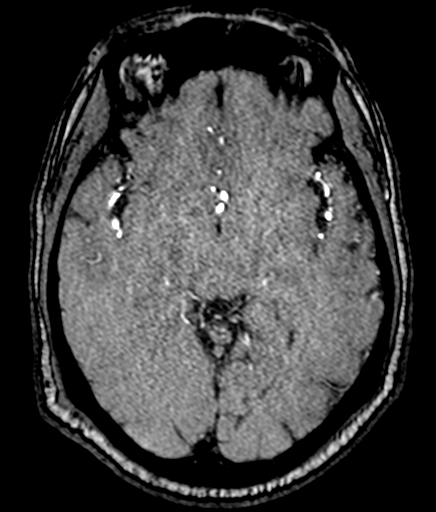
[im 156/188]
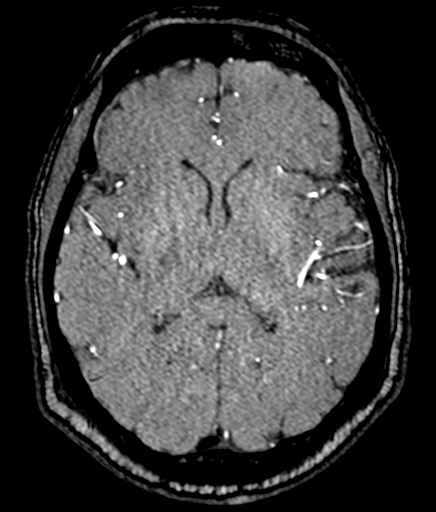
[im 160/188]
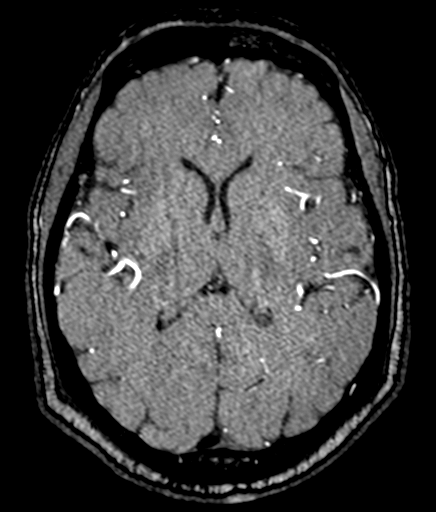
[im 180/188]
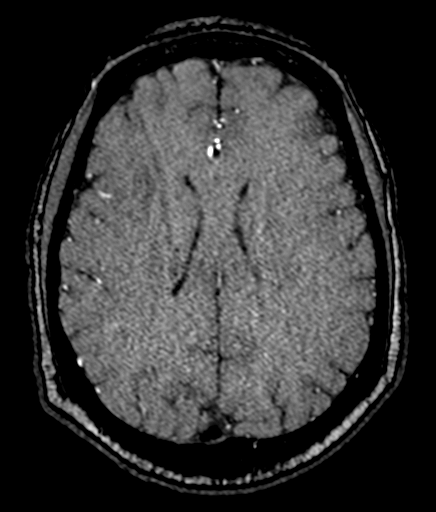

[23 of 48 positions shown; findings below may reference images not displayed]

FINDINGS: ANTERIOR CIRCULATION: Normal flow related enhancement of the
included cervical, petrous, cavernous and supraclinoid internal
carotid arteries. Patent anterior communicating artery. Patent
anterior and middle cerebral arteries, mild luminal irregularity
compatible with atherosclerosis.

No large vessel occlusion, flow limiting stenosis, aneurysm.

POSTERIOR CIRCULATION: LEFT vertebral artery is dominant.
Vertebrobasilar arteries are patent, with normal flow related
enhancement of the main branch vessels. Patent posterior cerebral
arteries, mild luminal irregularity compatible with atherosclerosis.

No large vessel occlusion, flow limiting stenosis,  aneurysm.

ANATOMIC VARIANTS: None.

Source images and MIP images were reviewed.
IMPRESSION: 1. No emergent large vessel occlusion.
2. Mild intracranial atherosclerosis.
